# Patient Record
Sex: Male | Born: 1945 | Race: White | Hispanic: No | Marital: Married | Smoking: Never smoker
Health system: Southern US, Community
[De-identification: ages and names within clinical notes are randomized; demographics above are authoritative.]

## PROBLEM LIST (undated history)

## (undated) DIAGNOSIS — K5732 Diverticulitis of large intestine without perforation or abscess without bleeding: Secondary | ICD-10-CM

## (undated) DIAGNOSIS — E119 Type 2 diabetes mellitus without complications: Secondary | ICD-10-CM

## (undated) DIAGNOSIS — K56609 Unspecified intestinal obstruction, unspecified as to partial versus complete obstruction: Secondary | ICD-10-CM

## (undated) DIAGNOSIS — I10 Essential (primary) hypertension: Secondary | ICD-10-CM

## (undated) DIAGNOSIS — N529 Male erectile dysfunction, unspecified: Secondary | ICD-10-CM

## (undated) HISTORY — PX: CHOLECYSTECTOMY OPEN: SUR202

## (undated) HISTORY — PX: ABDOMINAL SURGERY: SHX537

## (undated) HISTORY — PX: BOWEL RESECTION: SHX1257

## (undated) HISTORY — PX: COLOSTOMY TAKEDOWN: SHX5783

## (undated) HISTORY — PX: CHOLECYSTECTOMY: SHX55

---

## 2011-07-09 ENCOUNTER — Ambulatory Visit (INDEPENDENT_AMBULATORY_CARE_PROVIDER_SITE_OTHER): Payer: PRIVATE HEALTH INSURANCE

## 2011-07-09 DIAGNOSIS — M25529 Pain in unspecified elbow: Secondary | ICD-10-CM

## 2011-07-09 DIAGNOSIS — R209 Unspecified disturbances of skin sensation: Secondary | ICD-10-CM

## 2011-07-09 DIAGNOSIS — M25519 Pain in unspecified shoulder: Secondary | ICD-10-CM

## 2011-07-09 DIAGNOSIS — M549 Dorsalgia, unspecified: Secondary | ICD-10-CM

## 2012-01-22 ENCOUNTER — Ambulatory Visit
Admission: RE | Admit: 2012-01-22 | Discharge: 2012-01-22 | Disposition: A | Discharge status: Home / Self Care | Payer: PRIVATE HEALTH INSURANCE | Source: Ambulatory Visit | Attending: Family Medicine | Admitting: Family Medicine

## 2012-01-22 ENCOUNTER — Other Ambulatory Visit: Payer: Self-pay | Admitting: Family Medicine

## 2012-01-22 DIAGNOSIS — R109 Unspecified abdominal pain: Secondary | ICD-10-CM

## 2014-10-23 DIAGNOSIS — M5030 Other cervical disc degeneration, unspecified cervical region: Secondary | ICD-10-CM | POA: Insufficient documentation

## 2014-11-09 ENCOUNTER — Other Ambulatory Visit: Payer: Self-pay | Admitting: Family Medicine

## 2014-11-09 DIAGNOSIS — Z87891 Personal history of nicotine dependence: Secondary | ICD-10-CM

## 2014-11-13 ENCOUNTER — Ambulatory Visit
Admission: RE | Admit: 2014-11-13 | Discharge: 2014-11-13 | Disposition: A | Discharge status: Home / Self Care | Payer: PRIVATE HEALTH INSURANCE | Source: Ambulatory Visit | Attending: Family Medicine | Admitting: Family Medicine

## 2014-11-13 DIAGNOSIS — Z87891 Personal history of nicotine dependence: Secondary | ICD-10-CM

## 2014-11-26 ENCOUNTER — Emergency Department (HOSPITAL_BASED_OUTPATIENT_CLINIC_OR_DEPARTMENT_OTHER): Payer: PPO

## 2014-11-26 ENCOUNTER — Encounter (HOSPITAL_BASED_OUTPATIENT_CLINIC_OR_DEPARTMENT_OTHER): Payer: Self-pay | Admitting: *Deleted

## 2014-11-26 ENCOUNTER — Inpatient Hospital Stay (HOSPITAL_BASED_OUTPATIENT_CLINIC_OR_DEPARTMENT_OTHER)
Admission: EM | Admit: 2014-11-26 | Discharge: 2014-11-30 | DRG: 390 | Disposition: A | Discharge status: Home / Self Care | Payer: PPO | Attending: Internal Medicine | Admitting: Internal Medicine

## 2014-11-26 DIAGNOSIS — K565 Intestinal adhesions [bands] with obstruction (postprocedural) (postinfection): Secondary | ICD-10-CM | POA: Diagnosis present

## 2014-11-26 DIAGNOSIS — R739 Hyperglycemia, unspecified: Secondary | ICD-10-CM | POA: Diagnosis present

## 2014-11-26 DIAGNOSIS — E1165 Type 2 diabetes mellitus with hyperglycemia: Secondary | ICD-10-CM | POA: Diagnosis present

## 2014-11-26 DIAGNOSIS — K5730 Diverticulosis of large intestine without perforation or abscess without bleeding: Secondary | ICD-10-CM | POA: Diagnosis present

## 2014-11-26 DIAGNOSIS — Z0189 Encounter for other specified special examinations: Secondary | ICD-10-CM

## 2014-11-26 DIAGNOSIS — Z87891 Personal history of nicotine dependence: Secondary | ICD-10-CM | POA: Diagnosis not present

## 2014-11-26 DIAGNOSIS — E119 Type 2 diabetes mellitus without complications: Secondary | ICD-10-CM | POA: Diagnosis not present

## 2014-11-26 DIAGNOSIS — Z9049 Acquired absence of other specified parts of digestive tract: Secondary | ICD-10-CM | POA: Diagnosis present

## 2014-11-26 DIAGNOSIS — K5669 Other intestinal obstruction: Secondary | ICD-10-CM | POA: Diagnosis not present

## 2014-11-26 DIAGNOSIS — K56609 Unspecified intestinal obstruction, unspecified as to partial versus complete obstruction: Secondary | ICD-10-CM | POA: Diagnosis present

## 2014-11-26 DIAGNOSIS — R109 Unspecified abdominal pain: Secondary | ICD-10-CM | POA: Diagnosis present

## 2014-11-26 HISTORY — DX: Type 2 diabetes mellitus without complications: E11.9

## 2014-11-26 HISTORY — DX: Diverticulitis of large intestine without perforation or abscess without bleeding: K57.32

## 2014-11-26 LAB — CBC WITH DIFFERENTIAL/PLATELET
BASOS ABS: 0.1 10*3/uL (ref 0.0–0.1)
Basophils Relative: 1 % (ref 0–1)
EOS PCT: 1 % (ref 0–5)
Eosinophils Absolute: 0.1 10*3/uL (ref 0.0–0.7)
HCT: 52.1 % — ABNORMAL HIGH (ref 39.0–52.0)
HEMOGLOBIN: 18 g/dL — AB (ref 13.0–17.0)
Lymphocytes Relative: 8 % — ABNORMAL LOW (ref 12–46)
Lymphs Abs: 1 10*3/uL (ref 0.7–4.0)
MCH: 31.3 pg (ref 26.0–34.0)
MCHC: 34.5 g/dL (ref 30.0–36.0)
MCV: 90.6 fL (ref 78.0–100.0)
MONO ABS: 1 10*3/uL (ref 0.1–1.0)
Monocytes Relative: 8 % (ref 3–12)
Neutro Abs: 10.8 10*3/uL — ABNORMAL HIGH (ref 1.7–7.7)
Neutrophils Relative %: 82 % — ABNORMAL HIGH (ref 43–77)
PLATELETS: 153 10*3/uL (ref 150–400)
RBC: 5.75 MIL/uL (ref 4.22–5.81)
RDW: 13.3 % (ref 11.5–15.5)
WBC: 13 10*3/uL — AB (ref 4.0–10.5)

## 2014-11-26 LAB — COMPREHENSIVE METABOLIC PANEL
ALK PHOS: 84 U/L (ref 38–126)
ALT: 30 U/L (ref 17–63)
ANION GAP: 12 (ref 5–15)
AST: 22 U/L (ref 15–41)
Albumin: 4.9 g/dL (ref 3.5–5.0)
BILIRUBIN TOTAL: 1.3 mg/dL — AB (ref 0.3–1.2)
BUN: 21 mg/dL — ABNORMAL HIGH (ref 6–20)
CHLORIDE: 103 mmol/L (ref 101–111)
CO2: 23 mmol/L (ref 22–32)
Calcium: 9.8 mg/dL (ref 8.9–10.3)
Creatinine, Ser: 1.24 mg/dL (ref 0.61–1.24)
GFR, EST NON AFRICAN AMERICAN: 58 mL/min — AB (ref >60)
Glucose, Bld: 169 mg/dL — ABNORMAL HIGH (ref 65–99)
Potassium: 3.8 mmol/L (ref 3.5–5.1)
Sodium: 138 mmol/L (ref 135–145)
Total Protein: 7.4 g/dL (ref 6.5–8.1)

## 2014-11-26 LAB — URINALYSIS, ROUTINE W REFLEX MICROSCOPIC
Glucose, UA: NEGATIVE mg/dL
Hgb urine dipstick: NEGATIVE
KETONES UR: 15 mg/dL — AB
LEUKOCYTES UA: NEGATIVE
Nitrite: NEGATIVE
PH: 5.5 (ref 5.0–8.0)
Protein, ur: 30 mg/dL — AB
SPECIFIC GRAVITY, URINE: 1.026 (ref 1.005–1.030)
Urobilinogen, UA: 0.2 mg/dL (ref 0.0–1.0)

## 2014-11-26 LAB — URINE MICROSCOPIC-ADD ON

## 2014-11-26 LAB — LIPASE, BLOOD: LIPASE: 23 U/L (ref 22–51)

## 2014-11-26 MED ORDER — LIDOCAINE VISCOUS 2 % MT SOLN
15.0000 mL | Freq: Once | OROMUCOSAL | Status: AC
  Administered 2014-11-26: 15 mL via OROMUCOSAL
  Filled 2014-11-26: qty 15

## 2014-11-26 MED ORDER — MORPHINE SULFATE 4 MG/ML IJ SOLN: 4.0000 mg | INTRAMUSCULAR | Status: DC | PRN

## 2014-11-26 MED ORDER — IOHEXOL 300 MG/ML  SOLN
50.0000 mL | Freq: Once | INTRAMUSCULAR | Status: AC | PRN
  Administered 2014-11-26: 50 mL via ORAL

## 2014-11-26 MED ORDER — SODIUM CHLORIDE 0.9 % IV SOLN
INTRAVENOUS | Status: DC
  Administered 2014-11-26: 1000 mL via INTRAVENOUS
  Administered 2014-11-28 – 2014-11-30 (×3): via INTRAVENOUS

## 2014-11-26 MED ORDER — SODIUM CHLORIDE 0.9 % IV SOLN
INTRAVENOUS | Status: DC
  Administered 2014-11-26: 18:00:00 via INTRAVENOUS

## 2014-11-26 MED ORDER — SODIUM CHLORIDE 0.9 % IV SOLN: INTRAVENOUS | Status: DC

## 2014-11-26 MED ORDER — ONDANSETRON HCL 4 MG/2ML IJ SOLN: 4.0000 mg | Freq: Four times a day (QID) | INTRAMUSCULAR | Status: DC | PRN

## 2014-11-26 MED ORDER — ONDANSETRON HCL 4 MG/2ML IJ SOLN
4.0000 mg | Freq: Once | INTRAMUSCULAR | Status: AC
  Administered 2014-11-26: 4 mg via INTRAVENOUS
  Filled 2014-11-26: qty 2

## 2014-11-26 MED ORDER — DIPHENHYDRAMINE HCL 50 MG/ML IJ SOLN
12.5000 mg | Freq: Once | INTRAMUSCULAR | Status: AC
  Administered 2014-11-27: 12.5 mg via INTRAVENOUS
  Filled 2014-11-26: qty 1

## 2014-11-26 MED ORDER — ACETAMINOPHEN 650 MG RE SUPP
650.0000 mg | Freq: Four times a day (QID) | RECTAL | Status: DC | PRN
Start: 2014-11-26 — End: 2014-11-30

## 2014-11-26 MED ORDER — MORPHINE SULFATE 2 MG/ML IJ SOLN
2.0000 mg | INTRAMUSCULAR | Status: DC | PRN
  Administered 2014-11-27 – 2014-11-28 (×4): 2 mg via INTRAVENOUS
  Filled 2014-11-26 (×4): qty 1

## 2014-11-26 MED ORDER — IOHEXOL 300 MG/ML  SOLN
100.0000 mL | Freq: Once | INTRAMUSCULAR | Status: AC | PRN
  Administered 2014-11-26: 100 mL via INTRAVENOUS

## 2014-11-26 MED ORDER — ONDANSETRON HCL 4 MG PO TABS: 4.0000 mg | ORAL_TABLET | Freq: Four times a day (QID) | ORAL | Status: DC | PRN

## 2014-11-26 MED ORDER — MORPHINE SULFATE 4 MG/ML IJ SOLN
4.0000 mg | Freq: Once | INTRAMUSCULAR | Status: AC
  Administered 2014-11-26: 4 mg via INTRAVENOUS
  Filled 2014-11-26: qty 1

## 2014-11-26 MED ORDER — ACETAMINOPHEN 325 MG PO TABS
650.0000 mg | ORAL_TABLET | Freq: Four times a day (QID) | ORAL | Status: DC | PRN
Start: 2014-11-26 — End: 2014-11-30

## 2014-11-26 NOTE — ED Notes (Signed)
Patient transported to CT 

## 2014-11-26 NOTE — ED Notes (Signed)
Sent here from [facility] for r/o SBO, pt c/o abd pain vomiting x 2 days

## 2014-11-26 NOTE — ED Notes (Signed)
Pt vomited x1.  

## 2014-11-26 NOTE — ED Notes (Signed)
Pt returned to room from CT

## 2014-11-26 NOTE — ED Provider Notes (Signed)
TIME SEEN: 5:35 PM  CHIEF COMPLAINT: Possible small bowel obstruction  HPI: Pt is a 69 y.o. male who has had a prior colon resection due to diverticulitis, cholecystectomy and presents to the emergency department from urgent care with possible bowel obstruction seen on x-ray. Denies history of small bowel obstruction. Reports that yesterday he started having diffuse abdominal cramping and distention of his abdomen. Yesterday had a small bowel movement. Has not had a bowel movement since it is not passing gas. Has had nausea and vomiting. No fever.  ROS: See HPI Constitutional: no fever  Eyes: no drainage  ENT: no runny nose   Cardiovascular:  no chest pain  Resp: no SOB  GI: no vomiting GU: no dysuria Integumentary: no rash  Allergy: no hives  Musculoskeletal: no leg swelling  Neurological: no slurred speech ROS otherwise negative  PAST MEDICAL HISTORY/PAST SURGICAL HISTORY:  Past Medical History  Diagnosis Date  . Diverticulitis of colon     MEDICATIONS:  Prior to Admission medications   Not on File    ALLERGIES:  No Known Allergies  SOCIAL HISTORY:  History  Substance Use Topics  . Smoking status: Never Smoker   . Smokeless tobacco: Not on file  . Alcohol Use: No    FAMILY HISTORY: History reviewed. No pertinent family history.  EXAM: BP 147/97 mmHg  Pulse 128  Temp(Src) 98.5 F (36.9 C)  Resp 18  Ht 5\' 11"  (1.803 m)  Wt 234 lb (106.142 kg)  BMI 32.65 kg/m2  SpO2 97% CONSTITUTIONAL: Alert and oriented and responds appropriately to questions. Well-appearing; well-nourished, appears uncomfortable but nontoxic HEAD: Normocephalic EYES: Conjunctivae clear, PERRL ENT: normal nose; no rhinorrhea; moist mucous membranes; pharynx without lesions noted NECK: Supple, no meningismus, no LAD  CARD: Regular and tachycardic; S1 and S2 appreciated; no murmurs, no clicks, no rubs, no gallops RESP: Normal chest excursion without splinting or tachypnea; breath sounds  clear and equal bilaterally; no wheezes, no rhonchi, no rales, no hypoxia or respiratory distress, speaking full sentences ABD/GI: Normal bowel sounds; distended with mild tympany, soft, diffuse tenderness to palpation, no rebound, no guarding, no peritoneal signs BACK:  The back appears normal and is non-tender to palpation, there is no CVA tenderness EXT: Normal ROM in all joints; non-tender to palpation; no edema; normal capillary refill; no cyanosis, no calf tenderness or swelling    SKIN: Normal color for age and race; warm NEURO: Moves all extremities equally, sensation to light touch intact diffusely, cranial nerves II through XII intact PSYCH: The patient's mood and manner are appropriate. Grooming and personal hygiene are appropriate.  MEDICAL DECISION MAKING: Patient here with possible obstruction seen on x-ray at urgent care. Has never had an obstruction before. We'll proceed with CT scan to confirm, labs, urine. Will give pain and nausea medicine as well as IV fluids.  ED PROGRESS: 8:20 PM Labs show leukocytosis of 13,000 with left shift. CT scan shows high grade bowel obstruction with transition point at the periumbilical region likely secondary to adhesions. Discussed with Dr. Harlow Asa with general surgery who see the patient in consult. Patient's primary care provider is Dr. Marijo File with [facility]. We'll discuss with hospitalist for admission. Patient updated with plan. We'll keep nothing by mouth and placed gastric tube. We'll continue IV hydration.   8:30 PM  Spoke with Dr. Chancy Milroy with hospitalist service who agrees on admission to med/surg bed at [facility]. Will transfer.  [place], DO 11/26/14 2032

## 2014-11-26 NOTE — H&P (Signed)
[facility] History and Physical  Tanner [name] WHQ:[id number] DOB: 1946/01/20 DOA: 11/26/2014  Referring physician: Pryor Curia, DO PCP: Tanner Kroner, MD   Chief Complaint: Abdominal pain  HPI: Tanner [name] is [name] 69 y.o. male with prior history of diverticulitis and s/p colon resection came to [facility] with possible bowel obstruction. [name] CT scan which was done shows presence of high grade mid small bowel obstruction which may be chronic by appearance on the  CT. He staets that he got up yesterday and was having pain all over his abdomen. Patient states he went to the urgent care and had [name] abdominal xray done which was suspicuous for obstruction. He was referred for [name] CT scan of the abdomen and this shows presence of possible bowel obstruction. He staets that he has had vomiting yesterday and today. He states the does feel [name] little better with the NG tube. He states in 1987 he had [name] perforation which is what lead to surgery and [name] temporary colostomy which was later taken down. He has no fevers or chills noted. He states that he has been having sweating when he had the nausea. He has no chest pain. He has no history of Diabetes or HTN. He states he recently had [name] physical exam and everything checked out OK as far as his labs etc.   Review of Systems:  Constitutional:  No weight loss, night sweats, Fevers, chills, fatigue.  HEENT:  No headaches, itching, ear ache, nasal congestion, post nasal drip,  Cardio-vascular:  No chest pain, swelling in lower extremities, dizziness, palpitations  GI:  No heartburn, indigestion, +abdominal pain, +nausea, +vomiting, no diarrhea  Resp:  No shortness of breath with exertion or at rest. No coughing up of blood.No change in color of mucus.No wheezing Skin:  no rash or lesions GU:  no dysuria, change in color of urine, no urgency or frequency  Musculoskeletal:  No joint pain or swelling. No decreased range of motion  Psych:  No change in  mood or affect. No depression or anxiety   Past Medical History  Diagnosis Date  . Diverticulitis of colon    Past Surgical History  Procedure Laterality Date  . Cholecystectomy    . Abdominal surgery    . Bowel resection     Social History:  reports that he has never smoked. He does not have any smokeless tobacco history on file. He reports that he does not drink alcohol or use illicit drugs.  No Known Allergies  History reviewed. No pertinent family history.   Prior to Admission medications   Not on File   Physical Exam: Filed Vitals:   11/26/14 2120 11/26/14 2200 11/26/14 2203 11/26/14 2228  BP: 159/87 155/98 160/86   Pulse: 107 106 103   Temp:  97.5 F (36.4 C) 97.7 F (36.5 C)   TempSrc:  Oral Oral   Resp: 16 16 16    Height:    5\' 11"  (1.803 m)  Weight:    106.595 kg (235 lb)  SpO2: 96% 95% 96%     Wt Readings from Last 3 Encounters:  11/26/14 106.595 kg (235 lb)    General:  Appears calm and comfortable Eyes: PERRL, normal lids, irises & conjunctiva ENT: grossly normal hearing, lips & tongue Neck: no LAD, masses or thyromegaly Cardiovascular: RRR, no m/r/g. No LE edema. Respiratory: CTA bilaterally, no w/r/r. Normal respiratory effort. Abdomen: soft, non tender BS present though diminshed Skin: no rash or induration seen on limited exam Musculoskeletal:  grossly normal tone BUE/BLE Psychiatric: grossly normal mood and affect, speech fluent and appropriate Neurologic: grossly non-focal.          Labs on Admission:  Basic Metabolic Panel:  Recent Labs Lab 11/26/14 1750  NA 138  K 3.8  CL 103  CO2 23  GLUCOSE 169*  BUN 21*  CREATININE 1.24  CALCIUM 9.8   Liver Function Tests:  Recent Labs Lab 11/26/14 1750  AST 22  ALT 30  ALKPHOS 84  BILITOT 1.3*  PROT 7.4  ALBUMIN 4.9    Recent Labs Lab 11/26/14 1750  LIPASE 23   No results for input(s): AMMONIA in the last 168 hours. CBC:  Recent Labs Lab 11/26/14 1750  WBC 13.0*    NEUTROABS 10.8*  HGB 18.0*  HCT 52.1*  MCV 90.6  PLT 153   Cardiac Enzymes: No results for input(s): CKTOTAL, CKMB, CKMBINDEX, TROPONINI in the last 168 hours.  BNP (last 3 results) No results for input(s): BNP in the last 8760 hours.  ProBNP (last 3 results) No results for input(s): PROBNP in the last 8760 hours.  CBG: No results for input(s): GLUCAP in the last 168 hours.  Radiological Exams on Admission: Ct Abdomen Pelvis W Contrast  11/26/2014   CLINICAL DATA:  Generalized abdominal pain with nausea and vomiting for 1 day. Leukocytosis. History of partial bowel resection, cholecystectomy and diverticulitis. Initial encounter.  EXAM: CT ABDOMEN AND PELVIS WITH CONTRAST  TECHNIQUE: Multidetector CT imaging of the abdomen and pelvis was performed using the standard protocol following bolus administration of intravenous contrast.  CONTRAST:  12mL OMNIPAQUE IOHEXOL 300 MG/ML SOLN, 163mL OMNIPAQUE IOHEXOL 300 MG/ML SOLN  COMPARISON:  Abdominal pelvic CT 01/22/2012.  FINDINGS: Lower chest: Clear lung bases. No significant pleural or pericardial effusion.  Hepatobiliary: The liver is normal in density without focal abnormality. No significant biliary dilatation status post cholecystectomy)  Pancreas: Unremarkable. No pancreatic ductal dilatation or surrounding inflammatory changes.  Spleen: Normal in size without focal abnormality.  Adrenals/Urinary Tract: Both adrenal glands appear normal.2 mm calculus in the lower pole of the right kidney is best seen on coronal image number 70. No other urinary tract calculi demonstrated. There are probable tiny cyst in the lower pole of the right kidney. The left kidney appears normal. There is no hydronephrosis. No bladder abnormality seen.  Stomach/Bowel: There is [name] small hiatal hernia. The stomach is distended with fluid and contrast. The duodenum, jejunum and proximal ileum are moderately distended with contrast. There is fecalization of the mid ileum  proximal to an apparent focal transition point in the periumbilical area, best seen on axial image 60. The distal small bowel and colon are relatively decompressed. The appendix is not clearly seen. There is no evidence of pericecal inflammation.There is mesenteric edema with mild interloop fluid in the mid abdomen, best seen on coronal images 44-54. There is no focal extraluminal fluid collection, extravasated enteric contrast or evidence of closed loop obstruction.  Vascular/Lymphatic: There are no enlarged abdominal or pelvic lymph nodes. There is moderate atherosclerosis of the aorta, its branches and the iliac arteries. No large vessel occlusion identified. No significant venous abnormalities.  Reproductive: Moderate enlargement of the prostate gland, measuring up to 6.7 cm transverse.  Other: There are probable postsurgical changes within the anterior abdominal wall. No recurrent hernia identified. The right rectus abdominus muscle is atrophied.  Musculoskeletal: No acute or significant osseous findings.  IMPRESSION: 1. High-grade mid small bowel obstruction, likely from adhesions. Focal transition point appears to  be within the periumbilical region. Underlying fecalization of the small bowel proximal to the transition point implies some chronicity. 2. There is mesenteric edema and [name] small amount of fluid, but no focal abscess. 3. Nonobstructing right renal calculus. No evidence of ureteral calculus or hydronephrosis.   Electronically Signed   By: Richardean Sale M.D.   On: 11/26/2014 19:54      Assessment/Plan Active Problems:   Bowel obstruction   SBO (small bowel obstruction)   Diverticulosis of colon without hemorrhage   Hyperglycemia   1. SBO -discussed case with surgery -will keep NPO -NGT to low wall intermittent suctioning -pain control no antibiotics per surgery at this time -if there is deterioration will need to go to OR tonight otherwise surgery will evaluate in  am  2. Diverticulosis of colon -no inflammation noted no indication for antibiotics  3. Hyperglycemia -will monitor FSBS -institute SSI if sugars are persistently elevated -will check A1C   Code Status: Full Code (must indicate code status--if unknown or must be presumed, indicate so) DVT Prophylaxis:SCD Family Communication: None (indicate person spoken with, if applicable, with phone number if by telephone) Disposition Plan: Home (indicate anticipated LOS)  Time spent: 19min  Tanner [name] [facility] Pager [phone]

## 2014-11-27 ENCOUNTER — Encounter (HOSPITAL_COMMUNITY): Payer: Self-pay | Admitting: General Surgery

## 2014-11-27 ENCOUNTER — Inpatient Hospital Stay (HOSPITAL_COMMUNITY): Payer: PPO

## 2014-11-27 DIAGNOSIS — K5730 Diverticulosis of large intestine without perforation or abscess without bleeding: Secondary | ICD-10-CM

## 2014-11-27 LAB — CBC
HCT: 50.3 % (ref 39.0–52.0)
Hemoglobin: 16.8 g/dL (ref 13.0–17.0)
MCH: 31.2 pg (ref 26.0–34.0)
MCHC: 33.4 g/dL (ref 30.0–36.0)
MCV: 93.5 fL (ref 78.0–100.0)
PLATELETS: 144 10*3/uL — AB (ref 150–400)
RBC: 5.38 MIL/uL (ref 4.22–5.81)
RDW: 13.4 % (ref 11.5–15.5)
WBC: 11 10*3/uL — ABNORMAL HIGH (ref 4.0–10.5)

## 2014-11-27 LAB — COMPREHENSIVE METABOLIC PANEL
ALT: 30 U/L (ref 17–63)
AST: 21 U/L (ref 15–41)
Albumin: 4.7 g/dL (ref 3.5–5.0)
Alkaline Phosphatase: 76 U/L (ref 38–126)
Anion gap: 13 (ref 5–15)
BUN: 21 mg/dL — ABNORMAL HIGH (ref 6–20)
CO2: 28 mmol/L (ref 22–32)
CREATININE: 1.16 mg/dL (ref 0.61–1.24)
Calcium: 9.2 mg/dL (ref 8.9–10.3)
Chloride: 99 mmol/L — ABNORMAL LOW (ref 101–111)
GFR calc non Af Amer: >60 mL/min (ref >60)
GLUCOSE: 142 mg/dL — AB (ref 65–99)
Potassium: 3.7 mmol/L (ref 3.5–5.1)
SODIUM: 140 mmol/L (ref 135–145)
Total Bilirubin: 1.6 mg/dL — ABNORMAL HIGH (ref 0.3–1.2)
Total Protein: 7.4 g/dL (ref 6.5–8.1)

## 2014-11-27 LAB — GLUCOSE, CAPILLARY
GLUCOSE-CAPILLARY: 147 mg/dL — AB (ref 65–99)
Glucose-Capillary: 145 mg/dL — ABNORMAL HIGH (ref 65–99)
Glucose-Capillary: 117 mg/dL — ABNORMAL HIGH (ref 65–99)
Glucose-Capillary: 120 mg/dL — ABNORMAL HIGH (ref 65–99)

## 2014-11-27 LAB — TSH: TSH: 3.187 u[IU]/mL (ref 0.350–4.500)

## 2014-11-27 MED ORDER — DIPHENHYDRAMINE HCL 50 MG/ML IJ SOLN
INTRAMUSCULAR | Status: AC
  Filled 2014-11-27: qty 1

## 2014-11-27 MED ORDER — DIATRIZOATE MEGLUMINE & SODIUM 66-10 % PO SOLN
90.0000 mL | Freq: Once | ORAL | Status: AC
  Administered 2014-11-27: 90 mL via NASOGASTRIC
  Filled 2014-11-27: qty 90

## 2014-11-27 MED ORDER — INSULIN ASPART 100 UNIT/ML ~~LOC~~ SOLN
0.0000 [IU] | SUBCUTANEOUS | Status: DC
  Administered 2014-11-27 – 2014-11-29 (×7): 1 [IU] via SUBCUTANEOUS

## 2014-11-27 NOTE — Consult Note (Signed)
Reason for Consult: small bowel obstruction Referring Physician: DR. Estill Cotta   HPI: Tanner [name] is a 69 year old male with a history of partial colectomy with subsequent colostomy takedown in the 1980s and an open cholecystectomy presenting with 4 days of abdominal pain. No aggravating or alleviating factors.  No modifying factors.  Characterized as sharp and cramping pain.  Time pattern is constant although severity waxes and wanes.  Location is to mid abdomen with radiation to the sides.  Associated with nausea, vomiting.  His last bowel movement was 4 days ago and is not passing flatus.  He is having hiccups.  He denies fever, chills or sweats.  His work up shows a high grade small bowel obstruction with a focal transition point within the periumbilical region.  We have therefore been asked to evaluate.   WBC is down to 11k from 13k, normal renal function.  He has about 539m in the cannister of dark bilious output.  No flatus or BM yet.    Past Medical History  Diagnosis Date  . Diverticulitis of colon     Past Surgical History  Procedure Laterality Date  . Cholecystectomy    . Abdominal surgery    . Bowel resection    . Cholecystectomy open    . Colostomy takedown  1980s    History reviewed. No pertinent family history.  Social History:  reports that he has never smoked. He does not have any smokeless tobacco history on file. He reports that he does not drink alcohol or use illicit drugs.  Allergies: No Known Allergies  Medications:  Prior to Admission medications   Not on File   Scheduled Meds: . diatrizoate meglumine-sodium  90 mL Per NG tube Once  . diphenhydrAMINE      . insulin aspart  0-9 Units Subcutaneous 6 times per day   Continuous Infusions: . sodium chloride 1,000 mL (11/26/14 2343)   PRN Meds:.acetaminophen **OR** acetaminophen, morphine injection, ondansetron **OR** ondansetron (ZOFRAN) IV   Results for orders placed or performed during the  hospital encounter of 11/26/14 (from the past 48 hour(s))  CBC with Differential     Status: Abnormal   Collection Time: 11/26/14  5:50 PM  Result Value Ref Range   WBC 13.0 (H) 4.0 - 10.5 K/uL   RBC 5.75 4.22 - 5.81 MIL/uL   Hemoglobin 18.0 (H) 13.0 - 17.0 g/dL   HCT 52.1 (H) 39.0 - 52.0 %   MCV 90.6 78.0 - 100.0 fL   MCH 31.3 26.0 - 34.0 pg   MCHC 34.5 30.0 - 36.0 g/dL   RDW 13.3 11.5 - 15.5 %   Platelets 153 150 - 400 K/uL   Neutrophils Relative % 82 (H) 43 - 77 %   Neutro Abs 10.8 (H) 1.7 - 7.7 K/uL   Lymphocytes Relative 8 (L) 12 - 46 %   Lymphs Abs 1.0 0.7 - 4.0 K/uL   Monocytes Relative 8 3 - 12 %   Monocytes Absolute 1.0 0.1 - 1.0 K/uL   Eosinophils Relative 1 0 - 5 %   Eosinophils Absolute 0.1 0.0 - 0.7 K/uL   Basophils Relative 1 0 - 1 %   Basophils Absolute 0.1 0.0 - 0.1 K/uL  Comprehensive metabolic panel     Status: Abnormal   Collection Time: 11/26/14  5:50 PM  Result Value Ref Range   Sodium 138 135 - 145 mmol/L   Potassium 3.8 3.5 - 5.1 mmol/L   Chloride 103 101 - 111 mmol/L  CO2 23 22 - 32 mmol/L   Glucose, Bld 169 (H) 65 - 99 mg/dL   BUN 21 (H) 6 - 20 mg/dL   Creatinine, Ser 1.24 0.61 - 1.24 mg/dL   Calcium 9.8 8.9 - 10.3 mg/dL   Total Protein 7.4 6.5 - 8.1 g/dL   Albumin 4.9 3.5 - 5.0 g/dL   AST 22 15 - 41 U/L   ALT 30 17 - 63 U/L   Alkaline Phosphatase 84 38 - 126 U/L   Total Bilirubin 1.3 (H) 0.3 - 1.2 mg/dL   GFR calc non Af Amer 58 (L) >60 mL/min   GFR calc Af Amer >60 >60 mL/min    Comment: (NOTE) The eGFR has been calculated using the CKD EPI equation. This calculation has not been validated in all clinical situations. eGFR's persistently <60 mL/min signify possible Chronic Kidney Disease.    Anion gap 12 5 - 15  Lipase, blood     Status: None   Collection Time: 11/26/14  5:50 PM  Result Value Ref Range   Lipase 23 22 - 51 U/L  Urinalysis, Routine w reflex microscopic     Status: Abnormal   Collection Time: 11/26/14  6:30 PM  Result  Value Ref Range   Color, Urine AMBER (A) YELLOW    Comment: BIOCHEMICALS MAY BE AFFECTED BY COLOR   APPearance CLEAR CLEAR   Specific Gravity, Urine 1.026 1.005 - 1.030   pH 5.5 5.0 - 8.0   Glucose, UA NEGATIVE NEGATIVE mg/dL   Hgb urine dipstick NEGATIVE NEGATIVE   Bilirubin Urine SMALL (A) NEGATIVE   Ketones, ur 15 (A) NEGATIVE mg/dL   Protein, ur 30 (A) NEGATIVE mg/dL   Urobilinogen, UA 0.2 0.0 - 1.0 mg/dL   Nitrite NEGATIVE NEGATIVE   Leukocytes, UA NEGATIVE NEGATIVE  Urine microscopic-add on     Status: Abnormal   Collection Time: 11/26/14  6:30 PM  Result Value Ref Range   Squamous Epithelial / LPF RARE RARE   WBC, UA 3-6 <3 WBC/hpf   RBC / HPF 0-2 <3 RBC/hpf   Bacteria, UA MANY (A) RARE   Casts GRANULAR CAST (A) NEGATIVE   Crystals CA OXALATE CRYSTALS (A) NEGATIVE   Urine-Other MUCOUS PRESENT   TSH     Status: None   Collection Time: 11/27/14  4:35 AM  Result Value Ref Range   TSH 3.187 0.350 - 4.500 uIU/mL  Comprehensive metabolic panel     Status: Abnormal   Collection Time: 11/27/14  4:35 AM  Result Value Ref Range   Sodium 140 135 - 145 mmol/L   Potassium 3.7 3.5 - 5.1 mmol/L   Chloride 99 (L) 101 - 111 mmol/L   CO2 28 22 - 32 mmol/L   Glucose, Bld 142 (H) 65 - 99 mg/dL   BUN 21 (H) 6 - 20 mg/dL   Creatinine, Ser 1.16 0.61 - 1.24 mg/dL   Calcium 9.2 8.9 - 10.3 mg/dL   Total Protein 7.4 6.5 - 8.1 g/dL   Albumin 4.7 3.5 - 5.0 g/dL   AST 21 15 - 41 U/L   ALT 30 17 - 63 U/L   Alkaline Phosphatase 76 38 - 126 U/L   Total Bilirubin 1.6 (H) 0.3 - 1.2 mg/dL   GFR calc non Af Amer >60 >60 mL/min   GFR calc Af Amer >60 >60 mL/min    Comment: (NOTE) The eGFR has been calculated using the CKD EPI equation. This calculation has not been validated in all clinical situations. eGFR's  persistently <60 mL/min signify possible Chronic Kidney Disease.    Anion gap 13 5 - 15  CBC     Status: Abnormal   Collection Time: 11/27/14  4:35 AM  Result Value Ref Range   WBC  11.0 (H) 4.0 - 10.5 K/uL   RBC 5.38 4.22 - 5.81 MIL/uL   Hemoglobin 16.8 13.0 - 17.0 g/dL   HCT 50.3 39.0 - 52.0 %   MCV 93.5 78.0 - 100.0 fL   MCH 31.2 26.0 - 34.0 pg   MCHC 33.4 30.0 - 36.0 g/dL   RDW 13.4 11.5 - 15.5 %   Platelets 144 (L) 150 - 400 K/uL  Glucose, capillary     Status: Abnormal   Collection Time: 11/27/14  9:18 AM  Result Value Ref Range   Glucose-Capillary 145 (H) 65 - 99 mg/dL    Ct Abdomen Pelvis W Contrast  11/26/2014   CLINICAL DATA:  Generalized abdominal pain with nausea and vomiting for 1 day. Leukocytosis. History of partial bowel resection, cholecystectomy and diverticulitis. Initial encounter.  EXAM: CT ABDOMEN AND PELVIS WITH CONTRAST  TECHNIQUE: Multidetector CT imaging of the abdomen and pelvis was performed using the standard protocol following bolus administration of intravenous contrast.  CONTRAST:  87m OMNIPAQUE IOHEXOL 300 MG/ML SOLN, 1035mOMNIPAQUE IOHEXOL 300 MG/ML SOLN  COMPARISON:  Abdominal pelvic CT 01/22/2012.  FINDINGS: Lower chest: Clear lung bases. No significant pleural or pericardial effusion.  Hepatobiliary: The liver is normal in density without focal abnormality. No significant biliary dilatation status post cholecystectomy)  Pancreas: Unremarkable. No pancreatic ductal dilatation or surrounding inflammatory changes.  Spleen: Normal in size without focal abnormality.  Adrenals/Urinary Tract: Both adrenal glands appear normal.2 mm calculus in the lower pole of the right kidney is best seen on coronal image number 70. No other urinary tract calculi demonstrated. There are probable tiny cyst in the lower pole of the right kidney. The left kidney appears normal. There is no hydronephrosis. No bladder abnormality seen.  Stomach/Bowel: There is a small hiatal hernia. The stomach is distended with fluid and contrast. The duodenum, jejunum and proximal ileum are moderately distended with contrast. There is fecalization of the mid ileum proximal to an  apparent focal transition point in the periumbilical area, best seen on axial image 60. The distal small bowel and colon are relatively decompressed. The appendix is not clearly seen. There is no evidence of pericecal inflammation.There is mesenteric edema with mild interloop fluid in the mid abdomen, best seen on coronal images 44-54. There is no focal extraluminal fluid collection, extravasated enteric contrast or evidence of closed loop obstruction.  Vascular/Lymphatic: There are no enlarged abdominal or pelvic lymph nodes. There is moderate atherosclerosis of the aorta, its branches and the iliac arteries. No large vessel occlusion identified. No significant venous abnormalities.  Reproductive: Moderate enlargement of the prostate gland, measuring up to 6.7 cm transverse.  Other: There are probable postsurgical changes within the anterior abdominal wall. No recurrent hernia identified. The right rectus abdominus muscle is atrophied.  Musculoskeletal: No acute or significant osseous findings.  IMPRESSION: 1. High-grade mid small bowel obstruction, likely from adhesions. Focal transition point appears to be within the periumbilical region. Underlying fecalization of the small bowel proximal to the transition point implies some chronicity. 2. There is mesenteric edema and a small amount of fluid, but no focal abscess. 3. Nonobstructing right renal calculus. No evidence of ureteral calculus or hydronephrosis.   Electronically Signed   By: WiCaryl Comes.  On: 11/26/2014 19:54   Dg Abd 2 Views  11/27/2014   CLINICAL DATA:  Small bowel obstruction.  EXAM: ABDOMEN - 2 VIEW  COMPARISON:  11/26/2014  FINDINGS: Persistent distended small bowel loops in left abdomen with air-fluid levels consistent with small bowel obstruction. NG tube in place with tip in mid stomach. Postcholecystectomy surgical clips. Contrast material from yesterday CT scan noted within urinary bladder.  IMPRESSION: Persistent distended small  bowel loops in left abdomen with air-fluid levels consistent with small bowel obstruction. NG tube in place.   Electronically Signed   By: Lahoma Crocker M.D.   On: 11/27/2014 08:52    Review of Systems  All other systems reviewed and are negative.  Blood pressure 160/91, pulse 94, temperature 97.9 F (36.6 C), temperature source Oral, resp. rate 16, height 5' 11"  (1.803 m), weight 106.595 kg (235 lb), SpO2 94 %. Physical Exam  Constitutional: He is oriented to person, place, and time. He appears well-developed and well-nourished. No distress.  Cardiovascular: Normal rate, regular rhythm, normal heart sounds and intact distal pulses.  Exam reveals no gallop and no friction rub.   No murmur heard. Respiratory: Effort normal and breath sounds normal. No respiratory distress. He has no wheezes. He has no rales. He exhibits no tenderness.  GI: Bowel sounds are normal. He exhibits distension. He exhibits no mass. There is no tenderness. There is no rebound and no guarding.  Musculoskeletal: Normal range of motion. He exhibits no edema or tenderness.  Neurological: He is alert and oriented to person, place, and time.  Skin: Skin is warm and dry. No rash noted. He is not diaphoretic. No erythema. No pallor.  Psychiatric: He has a normal mood and affect. His behavior is normal. Thought content normal.    Assessment/Plan: Small bowel obstruction: He has a previous exploratory laparotomy, partial colectomy with colostomy takedown and an open cholecystectomy.  Obstruction is likely secondary to adhesions. He does not appear toxic and does not need urgent surgical intervention.  I do not think the patient will open up.  Nonetheless, will initiate the small bowel protocol.  Discussed with nursing.  Continue with NGT decompression, bowel rest, monitor electrolytes, ambulate as tolerated. VTE prophylaxis-may add heparin/lovneox, SCDs FEN-NPO, NGT  Tanner [name] ANP-BC 11/27/2014, 10:12 AM

## 2014-11-27 NOTE — Progress Notes (Signed)
[facility]                                                                              Patient Demographics  Tanner [name], is a 69 y.o. male, DOB - [birth year], GGE:[id number]  Admit date - 11/26/2014   Admitting Physician Allyne Gee, MD  Outpatient Primary MD for the patient is Gara Kroner, MD  LOS - 1   Chief Complaint  Patient presents with  . Abdominal Pain       Brief HPI   Patient is a 69 year old male with prior history of diverticulitis, status post colon resection presented with abdominal pain at [facility]. Patient got up yesterday and was having pain all over his abdomen, went to urgent care, had an abdominal x-ray which was suspicious for obstruction. CT scan of the abdomen showed high-grade mid small bowel obstruction likely from adhesions. Patient was admitted for further workup.   Assessment & Plan    Principal Problem:   SBO (small bowel obstruction) - Continue NPO, IV fluids, NG to low intermittent wall suction - Gen. surgery consulted, will follow recommendations  Active Problems:   Diverticulosis of colon without hemorrhage - Currently no inflammation noted on the CT scan    Hyperglycemia  follow hemoglobin A1, placed on sliding scale insulin c   Code Status: Full code  Family Communication: Discussed in detail with the patient, all imaging results, lab results explained to the patient and wife at bedside   Disposition Plan: home when SBO resolved  Time Spent in minutes  25 minutes  Procedures  CT abd  Consults   surgery  DVT Prophylaxis  SCD's  Medications  Scheduled Meds: . diphenhydrAMINE       Continuous Infusions: . sodium chloride 1,000 mL (11/26/14 2343)   PRN Meds:.acetaminophen **OR** acetaminophen, morphine injection, ondansetron **OR** ondansetron (ZOFRAN) IV   Antibiotics   Anti-infectives    None        Subjective:   Tanner [name] was seen and examined today.  Patient denies  dizziness, chest pain, shortness of breath, new weakness, numbess, tingling. No acute events overnight.  Abdominal pain currently controlled, no nausea or vomiting, bilious output in the NG suction  Objective:   Blood pressure 160/91, pulse 94, temperature 97.9 F (36.6 C), temperature source Oral, resp. rate 16, height 5\' 11"  (1.803 m), weight 106.595 kg (235 lb), SpO2 94 %.  Wt Readings from Last 3 Encounters:  11/26/14 106.595 kg (235 lb)     Intake/Output Summary (Last 24 hours) at 11/27/14 0829 Last data filed at 11/27/14 0600  Gross per 24 hour  Intake 314.17 ml  Output   1800 ml  Net -1485.83 ml    Exam  General: Alert and oriented x 3, NAD  HEENT:  PERRLA, EOMI, Anicteic Sclera, mucous membranes moist. NG tube  Neck: Supple, no JVD, no masses  CVS: S1 S2 auscultated, no rubs, murmurs or gallops. Regular rate and rhythm.  Respiratory: Clear to auscultation bilaterally, no wheezing, rales or rhonchi  Abdomen: Soft, nontender, nondistended, hypoactive bowel   Ext: no cyanosis clubbing or edema  Neuro: AAOx3, Cr N's II- XII. Strength  5/5 upper and lower extremities bilaterally  Skin: No rashes  Psych: Normal affect and demeanor, alert and oriented x3    Data Review   Micro Results No results found for this or any previous visit (from the past 240 hour(s)).  Radiology Reports Ct Abdomen Pelvis W Contrast  11/26/2014   CLINICAL DATA:  Generalized abdominal pain with nausea and vomiting for 1 day. Leukocytosis. History of partial bowel resection, cholecystectomy and diverticulitis. Initial encounter.  EXAM: CT ABDOMEN AND PELVIS WITH CONTRAST  TECHNIQUE: Multidetector CT imaging of the abdomen and pelvis was performed using the standard protocol following bolus administration of intravenous contrast.  CONTRAST:  35mL OMNIPAQUE IOHEXOL 300 MG/ML SOLN, 119mL OMNIPAQUE IOHEXOL 300 MG/ML SOLN  COMPARISON:  Abdominal pelvic CT 01/22/2012.  FINDINGS: Lower chest: Clear  lung bases. No significant pleural or pericardial effusion.  Hepatobiliary: The liver is normal in density without focal abnormality. No significant biliary dilatation status post cholecystectomy)  Pancreas: Unremarkable. No pancreatic ductal dilatation or surrounding inflammatory changes.  Spleen: Normal in size without focal abnormality.  Adrenals/Urinary Tract: Both adrenal glands appear normal.2 mm calculus in the lower pole of the right kidney is best seen on coronal image number 70. No other urinary tract calculi demonstrated. There are probable tiny cyst in the lower pole of the right kidney. The left kidney appears normal. There is no hydronephrosis. No bladder abnormality seen.  Stomach/Bowel: There is a small hiatal hernia. The stomach is distended with fluid and contrast. The duodenum, jejunum and proximal ileum are moderately distended with contrast. There is fecalization of the mid ileum proximal to an apparent focal transition point in the periumbilical area, best seen on axial image 60. The distal small bowel and colon are relatively decompressed. The appendix is not clearly seen. There is no evidence of pericecal inflammation.There is mesenteric edema with mild interloop fluid in the mid abdomen, best seen on coronal images 44-54. There is no focal extraluminal fluid collection, extravasated enteric contrast or evidence of closed loop obstruction.  Vascular/Lymphatic: There are no enlarged abdominal or pelvic lymph nodes. There is moderate atherosclerosis of the aorta, its branches and the iliac arteries. No large vessel occlusion identified. No significant venous abnormalities.  Reproductive: Moderate enlargement of the prostate gland, measuring up to 6.7 cm transverse.  Other: There are probable postsurgical changes within the anterior abdominal wall. No recurrent hernia identified. The right rectus abdominus muscle is atrophied.  Musculoskeletal: No acute or significant osseous findings.   IMPRESSION: 1. High-grade mid small bowel obstruction, likely from adhesions. Focal transition point appears to be within the periumbilical region. Underlying fecalization of the small bowel proximal to the transition point implies some chronicity. 2. There is mesenteric edema and a small amount of fluid, but no focal abscess. 3. Nonobstructing right renal calculus. No evidence of ureteral calculus or hydronephrosis.   Electronically Signed   By: Richardean Sale M.D.   On: 11/26/2014 19:54   [place] Screening Aaa  11/13/2014   CLINICAL DATA:  Former smoker.  Abdominal aortic aneurysm screening.  EXAM: ULTRASOUND OF ABDOMINAL AORTA  TECHNIQUE: Ultrasound examination of the abdominal aorta was performed to evaluate for abdominal aortic aneurysm.  COMPARISON:  CT abdomen pelvis - 01/22/2012  FINDINGS: Examination is degraded due to patient body habitus and overlying bowel gas.  Abdominal Aorta  There is mild fusiform ectasia of the mid aspect of the abdominal aorta.  Proximal:  2.9 x 2.9 cm.  Mid:  2.9 x 2.9 cm.  Distal:  2.1 x 2.4 cm.  Right common iliac artery:  Normal in size measuring 1.6 x 1.8 cm.  Left Common iliac artery:  Normal in size measuring 1.4 x 1.7 cm.  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 As such, a followup by ultrasound in 5 years is recommended. This recommendation follows ACR consensus guidelines: White Paper of the ACR Incidental Findings Committee II on Vascular Findings. J Am Coll Radiol 2013; 10:789-794.   Electronically Signed   By: Sandi Mariscal M.D.   On: 11/13/2014 08:23    CBC  Recent Labs Lab 11/26/14 1750 11/27/14 0435  WBC 13.0* 11.0*  HGB 18.0* 16.8  HCT 52.1* 50.3  PLT 153 144*  MCV 90.6 93.5  MCH 31.3 31.2  MCHC 34.5 33.4  RDW 13.3 13.4  LYMPHSABS 1.0  --   MONOABS 1.0  --   EOSABS 0.1  --   BASOSABS 0.1  --     Chemistries   Recent Labs Lab  11/26/14 1750 11/27/14 0435  NA 138 140  K 3.8 3.7  CL 103 99*  CO2 23 28  GLUCOSE 169* 142*  BUN 21* 21*  CREATININE 1.24 1.16  CALCIUM 9.8 9.2  AST 22 21  ALT 30 30  ALKPHOS 84 76  BILITOT 1.3* 1.6*   ------------------------------------------------------------------------------------------------------------------ estimated creatinine clearance is 74.6 mL/min (by C-G formula based on Cr of 1.16). ------------------------------------------------------------------------------------------------------------------ No results for input(s): HGBA1C in the last 72 hours. ------------------------------------------------------------------------------------------------------------------ No results for input(s): CHOL, HDL, LDLCALC, TRIG, CHOLHDL, LDLDIRECT in the last 72 hours. ------------------------------------------------------------------------------------------------------------------  Recent Labs  11/27/14 0435  TSH 3.187   ------------------------------------------------------------------------------------------------------------------ No results for input(s): VITAMINB12, FOLATE, FERRITIN, TIBC, IRON, RETICCTPCT in the last 72 hours.  Coagulation profile No results for input(s): INR, PROTIME in the last 168 hours.  No results for input(s): DDIMER in the last 72 hours.  Cardiac Enzymes No results for input(s): CKMB, TROPONINI, MYOGLOBIN in the last 168 hours.  Invalid input(s): CK ------------------------------------------------------------------------------------------------------------------ Invalid input(s): POCBNP  No results for input(s): GLUCAP in the last 72 hours.   Kirstin Kugler M.D. [facility] 11/27/2014, 8:29 AM  Pager: [phone]   Between 7am to 7pm - call Pager - [phone]  After 7pm go to [email or web address] - password [email or web address]  Call night coverage person covering after 7pm

## 2014-11-28 DIAGNOSIS — R739 Hyperglycemia, unspecified: Secondary | ICD-10-CM

## 2014-11-28 DIAGNOSIS — K5669 Other intestinal obstruction: Secondary | ICD-10-CM

## 2014-11-28 LAB — GLUCOSE, CAPILLARY
GLUCOSE-CAPILLARY: 123 mg/dL — AB (ref 65–99)
GLUCOSE-CAPILLARY: 122 mg/dL — AB (ref 65–99)
GLUCOSE-CAPILLARY: 116 mg/dL — AB (ref 65–99)
GLUCOSE-CAPILLARY: 125 mg/dL — AB (ref 65–99)
Glucose-Capillary: 105 mg/dL — ABNORMAL HIGH (ref 65–99)
Glucose-Capillary: 125 mg/dL — ABNORMAL HIGH (ref 65–99)

## 2014-11-28 LAB — BASIC METABOLIC PANEL
Anion gap: 12 (ref 5–15)
BUN: 25 mg/dL — ABNORMAL HIGH (ref 6–20)
CO2: 27 mmol/L (ref 22–32)
Calcium: 8.8 mg/dL — ABNORMAL LOW (ref 8.9–10.3)
Chloride: 103 mmol/L (ref 101–111)
Creatinine, Ser: 1.14 mg/dL (ref 0.61–1.24)
GFR calc Af Amer: >60 mL/min (ref >60)
GFR calc non Af Amer: >60 mL/min (ref >60)
Glucose, Bld: 136 mg/dL — ABNORMAL HIGH (ref 65–99)
POTASSIUM: 3.9 mmol/L (ref 3.5–5.1)
Sodium: 142 mmol/L (ref 135–145)

## 2014-11-28 LAB — HEMOGLOBIN A1C
HEMOGLOBIN A1C: 6.5 % — AB (ref 4.8–5.6)
MEAN PLASMA GLUCOSE: 140 mg/dL

## 2014-11-28 LAB — CBC
HCT: 49.1 % (ref 39.0–52.0)
HEMOGLOBIN: 16.3 g/dL (ref 13.0–17.0)
MCH: 31.3 pg (ref 26.0–34.0)
MCHC: 33.2 g/dL (ref 30.0–36.0)
MCV: 94.2 fL (ref 78.0–100.0)
PLATELETS: 133 10*3/uL — AB (ref 150–400)
RBC: 5.21 MIL/uL (ref 4.22–5.81)
RDW: 13.3 % (ref 11.5–15.5)
WBC: 8.8 10*3/uL (ref 4.0–10.5)

## 2014-11-28 NOTE — Progress Notes (Signed)
Subjective: Passing gas.  Bowels starting to move.  No distension.  Objective: Vital signs in last 24 hours: Temp:  [97.7 F (36.5 C)-98.4 F (36.9 C)] 97.7 F (36.5 C) (05/14 0630) Pulse Rate:  [76-98] 76 (05/14 0630) Resp:  [16-18] 18 (05/14 0630) BP: (150-186)/(65-93) 186/89 mmHg (05/14 0630) SpO2:  [93 %-100 %] 97 % (05/14 0630) Last BM Date: 11/24/14  Intake/Output from previous day: 05/13 0701 - 05/14 0700 In: 1200 [I.V.:1200] Out: 2350 [Urine:300; Emesis/NG output:2050] Intake/Output this shift: Total I/O In: -  Out: 400 [Emesis/NG output:200; Other:200]  PE: General- In NAD Abdomen-soft, no tenderness or distension, multiple scars, few bowel sounds.  Lab Results:   Recent Labs  11/27/14 0435 11/28/14 0539  WBC 11.0* 8.8  HGB 16.8 16.3  HCT 50.3 49.1  PLT 144* 133*   BMET  Recent Labs  11/27/14 0435 11/28/14 0539  NA 140 142  K 3.7 3.9  CL 99* 103  CO2 28 27  GLUCOSE 142* 136*  BUN 21* 25*  CREATININE 1.16 1.14  CALCIUM 9.2 8.8*   PT/INR No results for input(s): LABPROT, INR in the last 72 hours. Comprehensive Metabolic Panel:    Component Value Date/Time   NA 142 11/28/2014 0539   NA 140 11/27/2014 0435   K 3.9 11/28/2014 0539   K 3.7 11/27/2014 0435   CL 103 11/28/2014 0539   CL 99* 11/27/2014 0435   CO2 27 11/28/2014 0539   CO2 28 11/27/2014 0435   BUN 25* 11/28/2014 0539   BUN 21* 11/27/2014 0435   CREATININE 1.14 11/28/2014 0539   CREATININE 1.16 11/27/2014 0435   GLUCOSE 136* 11/28/2014 0539   GLUCOSE 142* 11/27/2014 0435   CALCIUM 8.8* 11/28/2014 0539   CALCIUM 9.2 11/27/2014 0435   AST 21 11/27/2014 0435   AST 22 11/26/2014 1750   ALT 30 11/27/2014 0435   ALT 30 11/26/2014 1750   ALKPHOS 76 11/27/2014 0435   ALKPHOS 84 11/26/2014 1750   BILITOT 1.6* 11/27/2014 0435   BILITOT 1.3* 11/26/2014 1750   PROT 7.4 11/27/2014 0435   PROT 7.4 11/26/2014 1750   ALBUMIN 4.7 11/27/2014 0435   ALBUMIN 4.9 11/26/2014 1750      Studies/Results: Ct Abdomen Pelvis W Contrast  11/26/2014   CLINICAL DATA:  Generalized abdominal pain with nausea and vomiting for 1 day. Leukocytosis. History of partial bowel resection, cholecystectomy and diverticulitis. Initial encounter.  EXAM: CT ABDOMEN AND PELVIS WITH CONTRAST  TECHNIQUE: Multidetector CT imaging of the abdomen and pelvis was performed using the standard protocol following bolus administration of intravenous contrast.  CONTRAST:  12mL OMNIPAQUE IOHEXOL 300 MG/ML SOLN, 117mL OMNIPAQUE IOHEXOL 300 MG/ML SOLN  COMPARISON:  Abdominal pelvic CT 01/22/2012.  FINDINGS: Lower chest: Clear lung bases. No significant pleural or pericardial effusion.  Hepatobiliary: The liver is normal in density without focal abnormality. No significant biliary dilatation status post cholecystectomy)  Pancreas: Unremarkable. No pancreatic ductal dilatation or surrounding inflammatory changes.  Spleen: Normal in size without focal abnormality.  Adrenals/Urinary Tract: Both adrenal glands appear normal.2 mm calculus in the lower pole of the right kidney is best seen on coronal image number 70. No other urinary tract calculi demonstrated. There are probable tiny cyst in the lower pole of the right kidney. The left kidney appears normal. There is no hydronephrosis. No bladder abnormality seen.  Stomach/Bowel: There is a small hiatal hernia. The stomach is distended with fluid and contrast. The duodenum, jejunum and proximal ileum are moderately distended with contrast.  There is fecalization of the mid ileum proximal to an apparent focal transition point in the periumbilical area, best seen on axial image 60. The distal small bowel and colon are relatively decompressed. The appendix is not clearly seen. There is no evidence of pericecal inflammation.There is mesenteric edema with mild interloop fluid in the mid abdomen, best seen on coronal images 44-54. There is no focal extraluminal fluid collection,  extravasated enteric contrast or evidence of closed loop obstruction.  Vascular/Lymphatic: There are no enlarged abdominal or pelvic lymph nodes. There is moderate atherosclerosis of the aorta, its branches and the iliac arteries. No large vessel occlusion identified. No significant venous abnormalities.  Reproductive: Moderate enlargement of the prostate gland, measuring up to 6.7 cm transverse.  Other: There are probable postsurgical changes within the anterior abdominal wall. No recurrent hernia identified. The right rectus abdominus muscle is atrophied.  Musculoskeletal: No acute or significant osseous findings.  IMPRESSION: 1. High-grade mid small bowel obstruction, likely from adhesions. Focal transition point appears to be within the periumbilical region. Underlying fecalization of the small bowel proximal to the transition point implies some chronicity. 2. There is mesenteric edema and a small amount of fluid, but no focal abscess. 3. Nonobstructing right renal calculus. No evidence of ureteral calculus or hydronephrosis.   Electronically Signed   By: Richardean Sale M.D.   On: 11/26/2014 19:54   Dg Abd 2 Views  11/27/2014   CLINICAL DATA:  Small bowel obstruction.  EXAM: ABDOMEN - 2 VIEW  COMPARISON:  11/26/2014  FINDINGS: Persistent distended small bowel loops in left abdomen with air-fluid levels consistent with small bowel obstruction. NG tube in place with tip in mid stomach. Postcholecystectomy surgical clips. Contrast material from yesterday CT scan noted within urinary bladder.  IMPRESSION: Persistent distended small bowel loops in left abdomen with air-fluid levels consistent with small bowel obstruction. NG tube in place.   Electronically Signed   By: Lahoma Crocker M.D.   On: 11/27/2014 08:52   Dg Abd Portable 1v-small Bowel Obstruction Protocol-initial, 8 Hr Delay  11/27/2014   CLINICAL DATA:  Small-bowel obstruction protocol. 8 hour follow-up image.  EXAM: PORTABLE ABDOMEN - 1 VIEW   COMPARISON:  Abdominal radiograph performed earlier today at 11:22 a.m.  FINDINGS: Contrast is seen filling the colon. There is no evidence of small bowel obstruction, though there is still distention of small bowel loops at the left mid abdomen to 4.3 cm in maximal diameter. This may reflect some degree of residual small bowel dysmotility. No free intra-abdominal air is seen, though evaluation for free air is limited on supine views.  Clips are noted within the right upper quadrant, reflecting prior cholecystectomy. Mild degenerative change is noted at the lower lumbar spine. Contrast is seen partially filling the bladder.  IMPRESSION: Contrast noted filling the colon. No evidence of small bowel obstruction, though there is still distention of small bowel loops at the left mid abdomen to 4.3 cm in maximal diameter. This may reflect some degree of residual small bowel dysmotility. No free intra-abdominal air seen.   Electronically Signed   By: Garald Balding M.D.   On: 11/27/2014 19:10   Dg Abd Portable 1v-small Bowel Protocol-position Verification  11/27/2014   CLINICAL DATA:  Initial image for nasogastric tube placement. Gastrografin administered at 1044 hr, without following small bowel obstruction protocol.  EXAM: PORTABLE ABDOMEN - 1 VIEW  COMPARISON:  Earlier same day  FINDINGS: Nasogastric tube tip is in the gastric fundus. The gastric fundus is  opacified with contrast, unknown amount. Gas pattern is unremarkable on this limited image.  IMPRESSION: Nasogastric tube tip in the gastric fundus. The fundus is full of contrast. As noted in the technologist notes, protocol was not followed with respect to decompression time.   Electronically Signed   By: Nelson Chimes M.D.   On: 11/27/2014 12:02    Anti-infectives: Anti-infectives    None      Assessment Principal Problem:   SBO (small bowel obstruction)-contrast goes through to colon on SBO protocol x-ray; clinically improved as well    LOS: 2 days    Plan: Agree with ng clamping trial today and ice chips.   Camay Pedigo J 11/28/2014

## 2014-11-28 NOTE — Progress Notes (Signed)
[facility] PROGRESS NOTE Assessment/Plan: SBO (small bowel obstruction)  - Clamp NG tube, may have ICe chips. -  appreciate surgery assistance.   Hyperglycemia - A1c 6.5 cont SSI    Code Status: full Family Communication: none  Disposition Plan: home 2-3 days   Consultants:  surgery  Procedures:  Ct abd  Antibiotics:  noen  HPI/Subjective: No complains, Had BM this am. Passing gas  Objective: Filed Vitals:   11/27/14 0619 11/27/14 1400 11/27/14 2125 11/28/14 0630  BP: 160/91 150/65 156/93 186/89  Pulse: 94 95 98 76  Temp: 97.9 F (36.6 C) 98.4 F (36.9 C) 97.8 F (36.6 C) 97.7 F (36.5 C)  TempSrc: Oral Oral Oral Oral  Resp: 16 16 18 18   Height:      Weight:      SpO2: 94% 100% 93% 97%    Intake/Output Summary (Last 24 hours) at 11/28/14 1041 Last data filed at 11/28/14 1001  Gross per 24 hour  Intake   1000 ml  Output   2100 ml  Net  -1100 ml   Filed Weights   11/26/14 1728 11/26/14 2228  Weight: 106.142 kg (234 lb) 106.595 kg (235 lb)    Exam:  General: Alert, awake, oriented x3, in no acute distress.  HEENT: No bruits, no goiter.  Heart: Regular rate and rhythm. Lungs: Good air movement, clear Abdomen: Soft, nontender, nondistended, positive bowel sounds.  Neuro: Grossly intact, nonfocal.   Data Reviewed: Basic Metabolic Panel:  Recent Labs Lab 11/26/14 1750 11/27/14 0435 11/28/14 0539  NA 138 140 142  K 3.8 3.7 3.9  CL 103 99* 103  CO2 23 28 27   GLUCOSE 169* 142* 136*  BUN 21* 21* 25*  CREATININE 1.24 1.16 1.14  CALCIUM 9.8 9.2 8.8*   Liver Function Tests:  Recent Labs Lab 11/26/14 1750 11/27/14 0435  AST 22 21  ALT 30 30  ALKPHOS 84 76  BILITOT 1.3* 1.6*  PROT 7.4 7.4  ALBUMIN 4.9 4.7    Recent Labs Lab 11/26/14 1750  LIPASE 23   No results for input(s): AMMONIA in the last 168 hours. CBC:  Recent Labs Lab 11/26/14 1750 11/27/14 0435 11/28/14 0539  WBC 13.0* 11.0* 8.8  NEUTROABS 10.8*   --   --   HGB 18.0* 16.8 16.3  HCT 52.1* 50.3 49.1  MCV 90.6 93.5 94.2  PLT 153 144* 133*   Cardiac Enzymes: No results for input(s): CKTOTAL, CKMB, CKMBINDEX, TROPONINI in the last 168 hours. BNP (last 3 results) No results for input(s): BNP in the last 8760 hours.  ProBNP (last 3 results) No results for input(s): PROBNP in the last 8760 hours.  CBG:  Recent Labs Lab 11/27/14 1537 11/27/14 2019 11/28/14 0001 11/28/14 0408 11/28/14 0755  GLUCAP 117* 120* 125* 123* 122*    No results found for this or any previous visit (from the past 240 hour(s)).   Studies: Ct Abdomen Pelvis W Contrast  11/26/2014   CLINICAL DATA:  Generalized abdominal pain with nausea and vomiting for 1 day. Leukocytosis. History of partial bowel resection, cholecystectomy and diverticulitis. Initial encounter.  EXAM: CT ABDOMEN AND PELVIS WITH CONTRAST  TECHNIQUE: Multidetector CT imaging of the abdomen and pelvis was performed using the standard protocol following bolus administration of intravenous contrast.  CONTRAST:  64mL OMNIPAQUE IOHEXOL 300 MG/ML SOLN, 118mL OMNIPAQUE IOHEXOL 300 MG/ML SOLN  COMPARISON:  Abdominal pelvic CT 01/22/2012.  FINDINGS: Lower chest: Clear lung bases. No significant pleural or pericardial effusion.  Hepatobiliary: The  liver is normal in density without focal abnormality. No significant biliary dilatation status post cholecystectomy)  Pancreas: Unremarkable. No pancreatic ductal dilatation or surrounding inflammatory changes.  Spleen: Normal in size without focal abnormality.  Adrenals/Urinary Tract: Both adrenal glands appear normal.2 mm calculus in the lower pole of the right kidney is best seen on coronal image number 70. No other urinary tract calculi demonstrated. There are probable tiny cyst in the lower pole of the right kidney. The left kidney appears normal. There is no hydronephrosis. No bladder abnormality seen.  Stomach/Bowel: There is a small hiatal hernia. The stomach  is distended with fluid and contrast. The duodenum, jejunum and proximal ileum are moderately distended with contrast. There is fecalization of the mid ileum proximal to an apparent focal transition point in the periumbilical area, best seen on axial image 60. The distal small bowel and colon are relatively decompressed. The appendix is not clearly seen. There is no evidence of pericecal inflammation.There is mesenteric edema with mild interloop fluid in the mid abdomen, best seen on coronal images 44-54. There is no focal extraluminal fluid collection, extravasated enteric contrast or evidence of closed loop obstruction.  Vascular/Lymphatic: There are no enlarged abdominal or pelvic lymph nodes. There is moderate atherosclerosis of the aorta, its branches and the iliac arteries. No large vessel occlusion identified. No significant venous abnormalities.  Reproductive: Moderate enlargement of the prostate gland, measuring up to 6.7 cm transverse.  Other: There are probable postsurgical changes within the anterior abdominal wall. No recurrent hernia identified. The right rectus abdominus muscle is atrophied.  Musculoskeletal: No acute or significant osseous findings.  IMPRESSION: 1. High-grade mid small bowel obstruction, likely from adhesions. Focal transition point appears to be within the periumbilical region. Underlying fecalization of the small bowel proximal to the transition point implies some chronicity. 2. There is mesenteric edema and a small amount of fluid, but no focal abscess. 3. Nonobstructing right renal calculus. No evidence of ureteral calculus or hydronephrosis.   Electronically Signed   By: Richardean Sale M.D.   On: 11/26/2014 19:54   Dg Abd 2 Views  11/27/2014   CLINICAL DATA:  Small bowel obstruction.  EXAM: ABDOMEN - 2 VIEW  COMPARISON:  11/26/2014  FINDINGS: Persistent distended small bowel loops in left abdomen with air-fluid levels consistent with small bowel obstruction. NG tube in place  with tip in mid stomach. Postcholecystectomy surgical clips. Contrast material from yesterday CT scan noted within urinary bladder.  IMPRESSION: Persistent distended small bowel loops in left abdomen with air-fluid levels consistent with small bowel obstruction. NG tube in place.   Electronically Signed   By: Lahoma Crocker M.D.   On: 11/27/2014 08:52   Dg Abd Portable 1v-small Bowel Obstruction Protocol-initial, 8 Hr Delay  11/27/2014   CLINICAL DATA:  Small-bowel obstruction protocol. 8 hour follow-up image.  EXAM: PORTABLE ABDOMEN - 1 VIEW  COMPARISON:  Abdominal radiograph performed earlier today at 11:22 a.m.  FINDINGS: Contrast is seen filling the colon. There is no evidence of small bowel obstruction, though there is still distention of small bowel loops at the left mid abdomen to 4.3 cm in maximal diameter. This may reflect some degree of residual small bowel dysmotility. No free intra-abdominal air is seen, though evaluation for free air is limited on supine views.  Clips are noted within the right upper quadrant, reflecting prior cholecystectomy. Mild degenerative change is noted at the lower lumbar spine. Contrast is seen partially filling the bladder.  IMPRESSION: Contrast noted filling  the colon. No evidence of small bowel obstruction, though there is still distention of small bowel loops at the left mid abdomen to 4.3 cm in maximal diameter. This may reflect some degree of residual small bowel dysmotility. No free intra-abdominal air seen.   Electronically Signed   By: Garald Balding M.D.   On: 11/27/2014 19:10   Dg Abd Portable 1v-small Bowel Protocol-position Verification  11/27/2014   CLINICAL DATA:  Initial image for nasogastric tube placement. Gastrografin administered at 1044 hr, without following small bowel obstruction protocol.  EXAM: PORTABLE ABDOMEN - 1 VIEW  COMPARISON:  Earlier same day  FINDINGS: Nasogastric tube tip is in the gastric fundus. The gastric fundus is opacified with  contrast, unknown amount. Gas pattern is unremarkable on this limited image.  IMPRESSION: Nasogastric tube tip in the gastric fundus. The fundus is full of contrast. As noted in the technologist notes, protocol was not followed with respect to decompression time.   Electronically Signed   By: Nelson Chimes M.D.   On: 11/27/2014 12:02    Scheduled Meds: . insulin aspart  0-9 Units Subcutaneous 6 times per day   Continuous Infusions: . sodium chloride 50 mL/hr at 11/28/14 1032    Time Spent: 25 min   Charlynne Cousins  [facility] Pager [phone]. If 7PM-7AM, please contact night-coverage at [email or web address], password [facility] 11/28/2014, 10:41 AM  LOS: 2 days

## 2014-11-29 DIAGNOSIS — E119 Type 2 diabetes mellitus without complications: Secondary | ICD-10-CM

## 2014-11-29 LAB — BASIC METABOLIC PANEL
ANION GAP: 12 (ref 5–15)
BUN: 23 mg/dL — AB (ref 6–20)
CHLORIDE: 106 mmol/L (ref 101–111)
CO2: 25 mmol/L (ref 22–32)
Calcium: 8.7 mg/dL — ABNORMAL LOW (ref 8.9–10.3)
Creatinine, Ser: 1 mg/dL (ref 0.61–1.24)
GFR calc Af Amer: >60 mL/min (ref >60)
GFR calc non Af Amer: >60 mL/min (ref >60)
Glucose, Bld: 129 mg/dL — ABNORMAL HIGH (ref 65–99)
Potassium: 3.7 mmol/L (ref 3.5–5.1)
Sodium: 143 mmol/L (ref 135–145)

## 2014-11-29 LAB — CBC
HEMATOCRIT: 47 % (ref 39.0–52.0)
HEMOGLOBIN: 15.4 g/dL (ref 13.0–17.0)
MCH: 30.8 pg (ref 26.0–34.0)
MCHC: 32.8 g/dL (ref 30.0–36.0)
MCV: 94 fL (ref 78.0–100.0)
Platelets: 134 10*3/uL — ABNORMAL LOW (ref 150–400)
RBC: 5 MIL/uL (ref 4.22–5.81)
RDW: 13.1 % (ref 11.5–15.5)
WBC: 8.3 10*3/uL (ref 4.0–10.5)

## 2014-11-29 LAB — GLUCOSE, CAPILLARY
GLUCOSE-CAPILLARY: 115 mg/dL — AB (ref 65–99)
GLUCOSE-CAPILLARY: 118 mg/dL — AB (ref 65–99)
GLUCOSE-CAPILLARY: 130 mg/dL — AB (ref 65–99)
GLUCOSE-CAPILLARY: 140 mg/dL — AB (ref 65–99)
GLUCOSE-CAPILLARY: 89 mg/dL (ref 65–99)
Glucose-Capillary: 123 mg/dL — ABNORMAL HIGH (ref 65–99)

## 2014-11-29 MED ORDER — METFORMIN HCL 500 MG PO TABS
500.0000 mg | ORAL_TABLET | Freq: Two times a day (BID) | ORAL | Status: DC
  Administered 2014-11-29 – 2014-11-30 (×2): 500 mg via ORAL
  Filled 2014-11-29 (×4): qty 1

## 2014-11-29 NOTE — Progress Notes (Addendum)
[facility] PROGRESS NOTE Assessment/Plan: SBO (small bowel obstruction)  - D/c NG tube, start clear advance as tolerate it. -  appreciate surgery assistance.  Controlled DM mellitus type 2: - A1c 6.5 cont SSI. - Start Metformin.  Code Status: full Family Communication: none  Disposition Plan: home 1-2 days   Consultants:  surgery  Procedures:  Ct abd  Antibiotics:  noen  HPI/Subjective: Having BM this am. Passing gas  Objective: Filed Vitals:   11/28/14 1350 11/28/14 2147 11/29/14 0205 11/29/14 0619  BP: 185/86 174/94 130/64 177/87  Pulse: 82 91 94 74  Temp: 98.2 F (36.8 C) 98.2 F (36.8 C) 98.4 F (36.9 C) 97.9 F (36.6 C)  TempSrc: Oral Oral Oral Oral  Resp: 20 20 18 18   Height:      Weight:      SpO2: 96% 98% 95% 95%    Intake/Output Summary (Last 24 hours) at 11/29/14 0920 Last data filed at 11/29/14 0600  Gross per 24 hour  Intake   1260 ml  Output   1275 ml  Net    -15 ml   Filed Weights   11/26/14 1728 11/26/14 2228  Weight: 106.142 kg (234 lb) 106.595 kg (235 lb)    Exam:  General: Alert, awake, oriented x3, in no acute distress.  HEENT: No bruits, no goiter.  Heart: Regular rate and rhythm. Lungs: Good air movement, clear Abdomen: Soft, nontender, nondistended, positive bowel sounds.  Neuro: Grossly intact, nonfocal.   Data Reviewed: Basic Metabolic Panel:  Recent Labs Lab 11/26/14 1750 11/27/14 0435 11/28/14 0539 11/29/14 0515  NA 138 140 142 143  K 3.8 3.7 3.9 3.7  CL 103 99* 103 106  CO2 23 28 27 25   GLUCOSE 169* 142* 136* 129*  BUN 21* 21* 25* 23*  CREATININE 1.24 1.16 1.14 1.00  CALCIUM 9.8 9.2 8.8* 8.7*   Liver Function Tests:  Recent Labs Lab 11/26/14 1750 11/27/14 0435  AST 22 21  ALT 30 30  ALKPHOS 84 76  BILITOT 1.3* 1.6*  PROT 7.4 7.4  ALBUMIN 4.9 4.7    Recent Labs Lab 11/26/14 1750  LIPASE 23   No results for input(s): AMMONIA in the last 168 hours. CBC:  Recent Labs Lab  11/26/14 1750 11/27/14 0435 11/28/14 0539 11/29/14 0515  WBC 13.0* 11.0* 8.8 8.3  NEUTROABS 10.8*  --   --   --   HGB 18.0* 16.8 16.3 15.4  HCT 52.1* 50.3 49.1 47.0  MCV 90.6 93.5 94.2 94.0  PLT 153 144* 133* 134*   Cardiac Enzymes: No results for input(s): CKTOTAL, CKMB, CKMBINDEX, TROPONINI in the last 168 hours. BNP (last 3 results) No results for input(s): BNP in the last 8760 hours.  ProBNP (last 3 results) No results for input(s): PROBNP in the last 8760 hours.  CBG:  Recent Labs Lab 11/28/14 1621 11/28/14 1959 11/29/14 0002 11/29/14 0409 11/29/14 0829  GLUCAP 105* 125* 130* 115* 123*    No results found for this or any previous visit (from the past 240 hour(s)).   Studies: Dg Abd Portable 1v-small Bowel Obstruction Protocol-initial, 8 Hr Delay  11/27/2014   CLINICAL DATA:  Small-bowel obstruction protocol. 8 hour follow-up image.  EXAM: PORTABLE ABDOMEN - 1 VIEW  COMPARISON:  Abdominal radiograph performed earlier today at 11:22 a.m.  FINDINGS: Contrast is seen filling the colon. There is no evidence of small bowel obstruction, though there is still distention of small bowel loops at the left mid abdomen to 4.3 cm  in maximal diameter. This may reflect some degree of residual small bowel dysmotility. No free intra-abdominal air is seen, though evaluation for free air is limited on supine views.  Clips are noted within the right upper quadrant, reflecting prior cholecystectomy. Mild degenerative change is noted at the lower lumbar spine. Contrast is seen partially filling the bladder.  IMPRESSION: Contrast noted filling the colon. No evidence of small bowel obstruction, though there is still distention of small bowel loops at the left mid abdomen to 4.3 cm in maximal diameter. This may reflect some degree of residual small bowel dysmotility. No free intra-abdominal air seen.   Electronically Signed   By: Garald Balding M.D.   On: 11/27/2014 19:10   Dg Abd Portable 1v-small  Bowel Protocol-position Verification  11/27/2014   CLINICAL DATA:  Initial image for nasogastric tube placement. Gastrografin administered at 1044 hr, without following small bowel obstruction protocol.  EXAM: PORTABLE ABDOMEN - 1 VIEW  COMPARISON:  Earlier same day  FINDINGS: Nasogastric tube tip is in the gastric fundus. The gastric fundus is opacified with contrast, unknown amount. Gas pattern is unremarkable on this limited image.  IMPRESSION: Nasogastric tube tip in the gastric fundus. The fundus is full of contrast. As noted in the technologist notes, protocol was not followed with respect to decompression time.   Electronically Signed   By: Nelson Chimes M.D.   On: 11/27/2014 12:02    Scheduled Meds: . insulin aspart  0-9 Units Subcutaneous 6 times per day   Continuous Infusions: . sodium chloride 50 mL/hr at 11/29/14 0636    Time Spent: 25 min   Charlynne Cousins  [facility] Pager [phone]. If 7PM-7AM, please contact night-coverage at [email or web address], password [facility] 11/29/2014, 9:20 AM  LOS: 3 days

## 2014-11-29 NOTE — Progress Notes (Signed)
  Subjective: Having BM's NG now out and tolerated the liquids he just had  Objective: Vital signs in last 24 hours: Temp:  [97.9 F (36.6 C)-98.4 F (36.9 C)] 97.9 F (36.6 C) (05/15 0619) Pulse Rate:  [74-94] 74 (05/15 0619) Resp:  [18-20] 18 (05/15 0619) BP: (130-185)/(64-94) 177/87 mmHg (05/15 0619) SpO2:  [95 %-98 %] 95 % (05/15 0619) Last BM Date: 11/29/14  Intake/Output from previous day: 05/14 0701 - 05/15 0700 In: 1260 [P.O.:60; I.V.:1200] Out: 1275 [Emesis/NG output:300] Intake/Output this shift: Total I/O In: 420 [P.O.:420] Out: -   Abdomen soft, non tender, non distended  Lab Results:   Recent Labs  11/28/14 0539 11/29/14 0515  WBC 8.8 8.3  HGB 16.3 15.4  HCT 49.1 47.0  PLT 133* 134*   BMET  Recent Labs  11/28/14 0539 11/29/14 0515  NA 142 143  K 3.9 3.7  CL 103 106  CO2 27 25  GLUCOSE 136* 129*  BUN 25* 23*  CREATININE 1.14 1.00  CALCIUM 8.8* 8.7*   PT/INR No results for input(s): LABPROT, INR in the last 72 hours. ABG No results for input(s): PHART, HCO3 in the last 72 hours.  Invalid input(s): PCO2, PO2  Studies/Results: Dg Abd Portable 1v-small Bowel Obstruction Protocol-initial, 8 Hr Delay  11/27/2014   CLINICAL DATA:  Small-bowel obstruction protocol. 8 hour follow-up image.  EXAM: PORTABLE ABDOMEN - 1 VIEW  COMPARISON:  Abdominal radiograph performed earlier today at 11:22 a.m.  FINDINGS: Contrast is seen filling the colon. There is no evidence of small bowel obstruction, though there is still distention of small bowel loops at the left mid abdomen to 4.3 cm in maximal diameter. This may reflect some degree of residual small bowel dysmotility. No free intra-abdominal air is seen, though evaluation for free air is limited on supine views.  Clips are noted within the right upper quadrant, reflecting prior cholecystectomy. Mild degenerative change is noted at the lower lumbar spine. Contrast is seen partially filling the bladder.   IMPRESSION: Contrast noted filling the colon. No evidence of small bowel obstruction, though there is still distention of small bowel loops at the left mid abdomen to 4.3 cm in maximal diameter. This may reflect some degree of residual small bowel dysmotility. No free intra-abdominal air seen.   Electronically Signed   By: Garald Balding M.D.   On: 11/27/2014 19:10    Anti-infectives: Anti-infectives    None      Assessment/Plan:  SBO  Seems to be resolving.  Advance as tolerated  LOS: 3 days    Tiburcio Linder A 11/29/2014

## 2014-11-30 ENCOUNTER — Encounter (HOSPITAL_COMMUNITY): Payer: Self-pay | Admitting: General Surgery

## 2014-11-30 DIAGNOSIS — E119 Type 2 diabetes mellitus without complications: Secondary | ICD-10-CM

## 2014-11-30 LAB — CBC
HEMATOCRIT: 43.4 % (ref 39.0–52.0)
HEMOGLOBIN: 14.1 g/dL (ref 13.0–17.0)
MCH: 30.6 pg (ref 26.0–34.0)
MCHC: 32.5 g/dL (ref 30.0–36.0)
MCV: 94.1 fL (ref 78.0–100.0)
PLATELETS: 132 10*3/uL — AB (ref 150–400)
RBC: 4.61 MIL/uL (ref 4.22–5.81)
RDW: 13.1 % (ref 11.5–15.5)
WBC: 6.5 10*3/uL (ref 4.0–10.5)

## 2014-11-30 LAB — BASIC METABOLIC PANEL
Anion gap: 8 (ref 5–15)
BUN: 19 mg/dL (ref 6–20)
CALCIUM: 8.8 mg/dL — AB (ref 8.9–10.3)
CHLORIDE: 106 mmol/L (ref 101–111)
CO2: 28 mmol/L (ref 22–32)
Creatinine, Ser: 1.12 mg/dL (ref 0.61–1.24)
GFR calc Af Amer: >60 mL/min (ref >60)
Glucose, Bld: 118 mg/dL — ABNORMAL HIGH (ref 65–99)
POTASSIUM: 4.1 mmol/L (ref 3.5–5.1)
SODIUM: 142 mmol/L (ref 135–145)

## 2014-11-30 LAB — GLUCOSE, CAPILLARY: Glucose-Capillary: 121 mg/dL — ABNORMAL HIGH (ref 65–99)

## 2014-11-30 MED ORDER — METFORMIN HCL 500 MG PO TABS: 500.0000 mg | ORAL_TABLET | Freq: Two times a day (BID) | ORAL | Status: AC

## 2014-11-30 NOTE — Progress Notes (Signed)
Patient ID: Tanner [name], male   DOB: 24-Sep-1945, 69 y.o.   MRN: [id number]     [facility]      [address] [place], [place] [address]    Phone: [phone] FAX: [phone]     Subjective: No issues. BM this AM.    Objective:  Vital signs:  Filed Vitals:   11/29/14 0619 11/29/14 1418 11/29/14 2135 11/30/14 0515  BP: 177/87 143/79 167/72 149/85  Pulse: 74 78 78 65  Temp: 97.9 F (36.6 C) 98.8 F (37.1 C) 98 F (36.7 C) 98.2 F (36.8 C)  TempSrc: Oral Oral Oral Oral  Resp: _0 Height:      Weight:      SpO2: 95% 97% 97% 96%    Last BM Date: 11/29/14  Intake/Output   Yesterday:  05/15 0701 - 05/16 0700 In: 1025 [P.O.:420; I.V.:1200] Out: 650 [Urine:300] This shift:  Total I/O In: -  Out: 250 [Urine:250]   Physical Exam: General: Pt awake/alert/oriented x4 in no acute distress Abdomen: Soft.  Nondistended.  Non tender.  No evidence of peritonitis.  No incarcerated hernias.    Problem List:   Principal Problem:   SBO (small bowel obstruction) Active Problems:   Diverticulosis of colon without hemorrhage   Hyperglycemia   Controlled type 2 diabetes mellitus without complication    Results:   Labs: Results for orders placed or performed during the hospital encounter of 11/26/14 (from the past 48 hour(s))  Glucose, capillary     Status: Abnormal   Collection Time: 11/28/14 12:18 PM  Result Value Ref Range   Glucose-Capillary 116 (H) 65 - 99 mg/dL  Glucose, capillary     Status: Abnormal   Collection Time: 11/28/14  4:21 PM  Result Value Ref Range   Glucose-Capillary 105 (H) 65 - 99 mg/dL  Glucose, capillary     Status: Abnormal   Collection Time: 11/28/14  7:59 PM  Result Value Ref Range   Glucose-Capillary 125 (H) 65 - 99 mg/dL  Glucose, capillary     Status: Abnormal   Collection Time: 11/29/14 12:02 AM  Result Value Ref Range   Glucose-Capillary 130 (H) 65 - 99 mg/dL   Glucose, capillary     Status: Abnormal   Collection Time: 11/29/14  4:09 AM  Result Value Ref Range   Glucose-Capillary 115 (H) 65 - 99 mg/dL  Basic metabolic panel     Status: Abnormal   Collection Time: 11/29/14  5:15 AM  Result Value Ref Range   Sodium 143 135 - 145 mmol/L   Potassium 3.7 3.5 - 5.1 mmol/L   Chloride 106 101 - 111 mmol/L   CO2 25 22 - 32 mmol/L   Glucose, Bld 129 (H) 65 - 99 mg/dL   BUN 23 (H) 6 - 20 mg/dL   Creatinine, Ser 1.00 0.61 - 1.24 mg/dL   Calcium 8.7 (L) 8.9 - 10.3 mg/dL   GFR calc non Af Amer >60 >60 mL/min   GFR calc Af Amer >60 >60 mL/min    Comment: (NOTE) The eGFR has been calculated using the CKD EPI equation. This calculation has not been validated in all clinical situations. eGFR's persistently <60 mL/min signify possible Chronic Kidney Disease.    Anion gap 12 5 - 15  CBC     Status: Abnormal   Collection Time: 11/29/14  5:15 AM  Result Value Ref Range   WBC 8.3 4.0 - 10.5 K/uL  RBC 5.00 4.22 - 5.81 MIL/uL   Hemoglobin 15.4 13.0 - 17.0 g/dL   HCT 47.0 39.0 - 52.0 %   MCV 94.0 78.0 - 100.0 fL   MCH 30.8 26.0 - 34.0 pg   MCHC 32.8 30.0 - 36.0 g/dL   RDW 13.1 11.5 - 15.5 %   Platelets 134 (L) 150 - 400 K/uL  Glucose, capillary     Status: Abnormal   Collection Time: 11/29/14  8:29 AM  Result Value Ref Range   Glucose-Capillary 123 (H) 65 - 99 mg/dL  Glucose, capillary     Status: Abnormal   Collection Time: 11/29/14 12:46 PM  Result Value Ref Range   Glucose-Capillary 118 (H) 65 - 99 mg/dL  Glucose, capillary     Status: Abnormal   Collection Time: 11/29/14  4:49 PM  Result Value Ref Range   Glucose-Capillary 140 (H) 65 - 99 mg/dL  Glucose, capillary     Status: None   Collection Time: 11/29/14  9:36 PM  Result Value Ref Range   Glucose-Capillary 89 65 - 99 mg/dL  Basic metabolic panel     Status: Abnormal   Collection Time: 11/30/14  5:11 AM  Result Value Ref Range   Sodium 142 135 - 145 mmol/L   Potassium 4.1 3.5 - 5.1  mmol/L   Chloride 106 101 - 111 mmol/L   CO2 28 22 - 32 mmol/L   Glucose, Bld 118 (H) 65 - 99 mg/dL   BUN 19 6 - 20 mg/dL   Creatinine, Ser 1.12 0.61 - 1.24 mg/dL   Calcium 8.8 (L) 8.9 - 10.3 mg/dL   GFR calc non Af Amer >60 >60 mL/min   GFR calc Af Amer >60 >60 mL/min    Comment: (NOTE) The eGFR has been calculated using the CKD EPI equation. This calculation has not been validated in all clinical situations. eGFR's persistently <60 mL/min signify possible Chronic Kidney Disease.    Anion gap 8 5 - 15  CBC     Status: Abnormal   Collection Time: 11/30/14  5:11 AM  Result Value Ref Range   WBC 6.5 4.0 - 10.5 K/uL   RBC 4.61 4.22 - 5.81 MIL/uL   Hemoglobin 14.1 13.0 - 17.0 g/dL   HCT 43.4 39.0 - 52.0 %   MCV 94.1 78.0 - 100.0 fL   MCH 30.6 26.0 - 34.0 pg   MCHC 32.5 30.0 - 36.0 g/dL   RDW 13.1 11.5 - 15.5 %   Platelets 132 (L) 150 - 400 K/uL  Glucose, capillary     Status: Abnormal   Collection Time: 11/30/14  8:13 AM  Result Value Ref Range   Glucose-Capillary 121 (H) 65 - 99 mg/dL    Imaging / Studies: No results found.  Medications / Allergies:  Scheduled Meds: . metFORMIN  500 mg Oral BID WC   Continuous Infusions: . sodium chloride 50 mL/hr at 11/30/14 0511   PRN Meds:.acetaminophen **OR** acetaminophen, morphine injection, ondansetron **OR** ondansetron (ZOFRAN) IV  Antibiotics: Anti-infectives    None        Assessment/Plan SBO-tolerating POs, having BMs, abd pain resolved.  Stable for DC From surgical standpoint.   Erby Pian, [facility] Surgery Pager [phone])   11/30/2014 9:02 AM

## 2014-11-30 NOTE — Discharge Summary (Signed)
Physician Discharge Summary  Tanner [name]:[id number] DOB: 06/24/1946 DOA: 11/26/2014  PCP: Gara Kroner, MD  Admit date: 11/26/2014 Discharge date: 11/30/2014  Time spent: 20 minutes  Recommendations for Outpatient Follow-up:  1. Follow up with Surgery as an outpatient for SBO follow up. 2. PCP in 2-4 week Newly diagnose DM follow up    Discharge Diagnoses:  Principal Problem:   SBO (small bowel obstruction) Active Problems:   Diverticulosis of colon without hemorrhage   Hyperglycemia   Controlled type 2 diabetes mellitus without complication   Discharge Condition: stable  Diet recommendation: carb modified  Filed Weights   11/26/14 1728 11/26/14 2228  Weight: 106.142 kg (234 lb) 106.595 kg (235 lb)    History of present illness:  69 y.o. male with prior history of diverticulitis and s/p colon resection came to [facility] with possible bowel obstruction. A CT scan which was done shows presence of high grade mid small bowel obstruction which may be chronic by appearance on the CT. He staets that he got up yesterday and was having pain all over his abdomen. Patient states he went to the urgent care and had a abdominal xray done which was suspicuous for obstruction. He was referred for a CT scan of the abdomen and this shows presence of possible bowel obstruction. He staets that he has had vomiting yesterday and today. He states the does feel a little better with the NG tube. He states in 1987 he had a perforation which is what lead to surgery and a temporary colostomy which was later taken down. He has no fevers or chills noted. He states that he has been having sweating when he had the nausea. He has no chest pain. He has no history of Diabetes or HTN. He states he recently had a physical exam and everything checked out OK as far as his labs etc.  Hospital Course:  SBO (small bowel obstruction): - Surgery consulted, treated conservatively with IV fluids and NG tube at  intermittent suction with resolution of symptoms. - diet advance and passing BM -Appreciate surgery assistance.  Newly diagnosed DM mellitus type 2: - A1c 6.5 cont SSI. - Start Metformin.  Procedures:  CT abd and pelvis  Consultations:  Surgery  Discharge Exam: Filed Vitals:   11/30/14 0515  BP: 149/85  Pulse: 65  Temp: 98.2 F (36.8 C)  Resp: 16    General: A&O x3 Cardiovascular: RRR Respiratory: good air movement CTA B/L  Discharge Instructions   Discharge Instructions    Diet - low sodium heart healthy    Complete by:  As directed      Increase activity slowly    Complete by:  As directed           Current Discharge Medication List    START taking these medications   Details  metFORMIN (GLUCOPHAGE) 500 MG tablet Take 1 tablet (500 mg total) by mouth 2 (two) times daily with a meal. Qty: 60 tablet, Refills: 3       No Known Allergies Follow-up Information    Follow up with Gara Kroner, MD.   Specialty:  Family Medicine   Contact information:   [address] [place] [id number] [phone]        The results of significant diagnostics from this hospitalization (including imaging, microbiology, ancillary and laboratory) are listed below for reference.    Significant Diagnostic Studies: Ct Abdomen Pelvis W Contrast  11/26/2014   CLINICAL DATA:  Generalized abdominal pain  with nausea and vomiting for 1 day. Leukocytosis. History of partial bowel resection, cholecystectomy and diverticulitis. Initial encounter.  EXAM: CT ABDOMEN AND PELVIS WITH CONTRAST  TECHNIQUE: Multidetector CT imaging of the abdomen and pelvis was performed using the standard protocol following bolus administration of intravenous contrast.  CONTRAST:  66mL OMNIPAQUE IOHEXOL 300 MG/ML SOLN, 144mL OMNIPAQUE IOHEXOL 300 MG/ML SOLN  COMPARISON:  Abdominal pelvic CT 01/22/2012.  FINDINGS: Lower chest: Clear lung bases. No significant pleural or pericardial  effusion.  Hepatobiliary: The liver is normal in density without focal abnormality. No significant biliary dilatation status post cholecystectomy)  Pancreas: Unremarkable. No pancreatic ductal dilatation or surrounding inflammatory changes.  Spleen: Normal in size without focal abnormality.  Adrenals/Urinary Tract: Both adrenal glands appear normal.2 mm calculus in the lower pole of the right kidney is best seen on coronal image number 70. No other urinary tract calculi demonstrated. There are probable tiny cyst in the lower pole of the right kidney. The left kidney appears normal. There is no hydronephrosis. No bladder abnormality seen.  Stomach/Bowel: There is a small hiatal hernia. The stomach is distended with fluid and contrast. The duodenum, jejunum and proximal ileum are moderately distended with contrast. There is fecalization of the mid ileum proximal to an apparent focal transition point in the periumbilical area, best seen on axial image 60. The distal small bowel and colon are relatively decompressed. The appendix is not clearly seen. There is no evidence of pericecal inflammation.There is mesenteric edema with mild interloop fluid in the mid abdomen, best seen on coronal images 44-54. There is no focal extraluminal fluid collection, extravasated enteric contrast or evidence of closed loop obstruction.  Vascular/Lymphatic: There are no enlarged abdominal or pelvic lymph nodes. There is moderate atherosclerosis of the aorta, its branches and the iliac arteries. No large vessel occlusion identified. No significant venous abnormalities.  Reproductive: Moderate enlargement of the prostate gland, measuring up to 6.7 cm transverse.  Other: There are probable postsurgical changes within the anterior abdominal wall. No recurrent hernia identified. The right rectus abdominus muscle is atrophied.  Musculoskeletal: No acute or significant osseous findings.  IMPRESSION: 1. High-grade mid small bowel obstruction,  likely from adhesions. Focal transition point appears to be within the periumbilical region. Underlying fecalization of the small bowel proximal to the transition point implies some chronicity. 2. There is mesenteric edema and a small amount of fluid, but no focal abscess. 3. Nonobstructing right renal calculus. No evidence of ureteral calculus or hydronephrosis.   Electronically Signed   By: Richardean Sale M.D.   On: 11/26/2014 19:54   [place] Screening Aaa  11/13/2014   CLINICAL DATA:  Former smoker.  Abdominal aortic aneurysm screening.  EXAM: ULTRASOUND OF ABDOMINAL AORTA  TECHNIQUE: Ultrasound examination of the abdominal aorta was performed to evaluate for abdominal aortic aneurysm.  COMPARISON:  CT abdomen pelvis - 01/22/2012  FINDINGS: Examination is degraded due to patient body habitus and overlying bowel gas.  Abdominal Aorta  There is mild fusiform ectasia of the mid aspect of the abdominal aorta.  Proximal:  2.9 x 2.9 cm.  Mid:  2.9 x 2.9 cm.  Distal:  2.1 x 2.4 cm.  Right common iliac artery:  Normal in size measuring 1.6 x 1.8 cm.  Left Common iliac artery:  Normal in size measuring 1.4 x 1.7 cm.  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 As such, a followup by ultrasound  in 5 years is recommended. This recommendation follows ACR consensus guidelines: White Paper of the ACR Incidental Findings Committee II on Vascular Findings. J Am Coll Radiol 2013; 10:789-794.   Electronically Signed   By: Sandi Mariscal M.D.   On: 11/13/2014 08:23   Dg Abd 2 Views  11/27/2014   CLINICAL DATA:  Small bowel obstruction.  EXAM: ABDOMEN - 2 VIEW  COMPARISON:  11/26/2014  FINDINGS: Persistent distended small bowel loops in left abdomen with air-fluid levels consistent with small bowel obstruction. NG tube in place with tip in mid stomach. Postcholecystectomy surgical clips. Contrast material from yesterday CT  scan noted within urinary bladder.  IMPRESSION: Persistent distended small bowel loops in left abdomen with air-fluid levels consistent with small bowel obstruction. NG tube in place.   Electronically Signed   By: Lahoma Crocker M.D.   On: 11/27/2014 08:52   Dg Abd Portable 1v-small Bowel Obstruction Protocol-initial, 8 Hr Delay  11/27/2014   CLINICAL DATA:  Small-bowel obstruction protocol. 8 hour follow-up image.  EXAM: PORTABLE ABDOMEN - 1 VIEW  COMPARISON:  Abdominal radiograph performed earlier today at 11:22 a.m.  FINDINGS: Contrast is seen filling the colon. There is no evidence of small bowel obstruction, though there is still distention of small bowel loops at the left mid abdomen to 4.3 cm in maximal diameter. This may reflect some degree of residual small bowel dysmotility. No free intra-abdominal air is seen, though evaluation for free air is limited on supine views.  Clips are noted within the right upper quadrant, reflecting prior cholecystectomy. Mild degenerative change is noted at the lower lumbar spine. Contrast is seen partially filling the bladder.  IMPRESSION: Contrast noted filling the colon. No evidence of small bowel obstruction, though there is still distention of small bowel loops at the left mid abdomen to 4.3 cm in maximal diameter. This may reflect some degree of residual small bowel dysmotility. No free intra-abdominal air seen.   Electronically Signed   By: Garald Balding M.D.   On: 11/27/2014 19:10   Dg Abd Portable 1v-small Bowel Protocol-position Verification  11/27/2014   CLINICAL DATA:  Initial image for nasogastric tube placement. Gastrografin administered at 1044 hr, without following small bowel obstruction protocol.  EXAM: PORTABLE ABDOMEN - 1 VIEW  COMPARISON:  Earlier same day  FINDINGS: Nasogastric tube tip is in the gastric fundus. The gastric fundus is opacified with contrast, unknown amount. Gas pattern is unremarkable on this limited image.  IMPRESSION: Nasogastric  tube tip in the gastric fundus. The fundus is full of contrast. As noted in the technologist notes, protocol was not followed with respect to decompression time.   Electronically Signed   By: Nelson Chimes M.D.   On: 11/27/2014 12:02    Microbiology: No results found for this or any previous visit (from the past 240 hour(s)).   Labs: Basic Metabolic Panel:  Recent Labs Lab 11/26/14 1750 11/27/14 0435 11/28/14 0539 11/29/14 0515 11/30/14 0511  NA 138 140 142 143 142  K 3.8 3.7 3.9 3.7 4.1  CL 103 99* 103 106 106  CO2 23 28 27 25 28   GLUCOSE 169* 142* 136* 129* 118*  BUN 21* 21* 25* 23* 19  CREATININE 1.24 1.16 1.14 1.00 1.12  CALCIUM 9.8 9.2 8.8* 8.7* 8.8*   Liver Function Tests:  Recent Labs Lab 11/26/14 1750 11/27/14 0435  AST 22 21  ALT 30 30  ALKPHOS 84 76  BILITOT 1.3* 1.6*  PROT 7.4 7.4  ALBUMIN 4.9 4.7  Recent Labs Lab 11/26/14 1750  LIPASE 23   No results for input(s): AMMONIA in the last 168 hours. CBC:  Recent Labs Lab 11/26/14 1750 11/27/14 0435 11/28/14 0539 11/29/14 0515 11/30/14 0511  WBC 13.0* 11.0* 8.8 8.3 6.5  NEUTROABS 10.8*  --   --   --   --   HGB 18.0* 16.8 16.3 15.4 14.1  HCT 52.1* 50.3 49.1 47.0 43.4  MCV 90.6 93.5 94.2 94.0 94.1  PLT 153 144* 133* 134* 132*   Cardiac Enzymes: No results for input(s): CKTOTAL, CKMB, CKMBINDEX, TROPONINI in the last 168 hours. BNP: BNP (last 3 results) No results for input(s): BNP in the last 8760 hours.  ProBNP (last 3 results) No results for input(s): PROBNP in the last 8760 hours.  CBG:  Recent Labs Lab 11/29/14 0829 11/29/14 1246 11/29/14 1649 11/29/14 2136 11/30/14 0813  GLUCAP 123* 118* 140* 89 121*       Signed:  FELIZ ORTIZ, ABRAHAM  [facility] 11/30/2014, 8:16 AM

## 2015-09-01 DIAGNOSIS — J019 Acute sinusitis, unspecified: Secondary | ICD-10-CM | POA: Diagnosis not present

## 2015-09-10 DIAGNOSIS — J209 Acute bronchitis, unspecified: Secondary | ICD-10-CM | POA: Diagnosis not present

## 2015-09-10 DIAGNOSIS — G4700 Insomnia, unspecified: Secondary | ICD-10-CM | POA: Diagnosis not present

## 2015-09-11 ENCOUNTER — Emergency Department (HOSPITAL_BASED_OUTPATIENT_CLINIC_OR_DEPARTMENT_OTHER)
Admission: EM | Admit: 2015-09-11 | Discharge: 2015-09-11 | Disposition: A | Discharge status: Home / Self Care | Payer: PPO | Attending: Emergency Medicine | Admitting: Emergency Medicine

## 2015-09-11 ENCOUNTER — Encounter (HOSPITAL_BASED_OUTPATIENT_CLINIC_OR_DEPARTMENT_OTHER): Payer: Self-pay | Admitting: Emergency Medicine

## 2015-09-11 DIAGNOSIS — Z8709 Personal history of other diseases of the respiratory system: Secondary | ICD-10-CM | POA: Diagnosis not present

## 2015-09-11 DIAGNOSIS — Z79899 Other long term (current) drug therapy: Secondary | ICD-10-CM | POA: Diagnosis not present

## 2015-09-11 DIAGNOSIS — Z8719 Personal history of other diseases of the digestive system: Secondary | ICD-10-CM | POA: Insufficient documentation

## 2015-09-11 DIAGNOSIS — E119 Type 2 diabetes mellitus without complications: Secondary | ICD-10-CM | POA: Insufficient documentation

## 2015-09-11 DIAGNOSIS — Z7984 Long term (current) use of oral hypoglycemic drugs: Secondary | ICD-10-CM | POA: Insufficient documentation

## 2015-09-11 DIAGNOSIS — Z792 Long term (current) use of antibiotics: Secondary | ICD-10-CM | POA: Insufficient documentation

## 2015-09-11 DIAGNOSIS — T380X5A Adverse effect of glucocorticoids and synthetic analogues, initial encounter: Secondary | ICD-10-CM | POA: Diagnosis not present

## 2015-09-11 DIAGNOSIS — Z72820 Sleep deprivation: Secondary | ICD-10-CM | POA: Diagnosis not present

## 2015-09-11 DIAGNOSIS — G478 Other sleep disorders: Secondary | ICD-10-CM | POA: Diagnosis not present

## 2015-09-11 MED ORDER — DIAZEPAM 10 MG PO TABS: 5.0000 mg | ORAL_TABLET | Freq: Three times a day (TID) | ORAL | Status: DC | PRN

## 2015-09-11 MED ORDER — DIAZEPAM 5 MG PO TABS
5.0000 mg | ORAL_TABLET | Freq: Once | ORAL | Status: AC
  Administered 2015-09-11: 5 mg via ORAL
  Filled 2015-09-11: qty 1

## 2015-09-11 NOTE — ED Provider Notes (Signed)
CSN: [id number]     Arrival date & time 09/11/15  0134 History   First MD Initiated Contact with Patient 09/11/15 0150     Chief Complaint  Patient presents with  . Insomnia     (Consider location/radiation/quality/duration/timing/severity/associated sxs/prior Treatment) HPI  This is a 70 year old male who was seen for flulike symptoms on the 15th of this month. He was placed on Zithromax and prednisone. For the last 6 days he has not been able to sleep at night. He stopped taking the prednisone 5 days ago but his insomnia persists. He was switched from Zithromax to Levaquin earlier this week and was prescribed a cough suppressant containing hydrocodone. He took his first dose of hydrocodone yesterday evening which allowed him to relax but not sleep. He denies hearing voices, suicidal or homicidal ideation. He does feel agitated.  Past Medical History  Diagnosis Date  . Diverticulitis of colon   . Diabetes mellitus without complication [facility])    Past Surgical History  Procedure Laterality Date  . Cholecystectomy    . Abdominal surgery    . Bowel resection    . Cholecystectomy open    . Colostomy takedown  1980s   History reviewed. No pertinent family history. Social History  Substance Use Topics  . Smoking status: Never Smoker   . Smokeless tobacco: None  . Alcohol Use: No    Review of Systems  All other systems reviewed and are negative.   Allergies  Review of patient's allergies indicates no known allergies.  Home Medications   Prior to Admission medications   Medication Sig Start Date End Date Taking? Authorizing Provider  HYDROcodone-homatropine (HYCODAN) 5-1.5 MG/5ML syrup Take 5 mLs by mouth every 6 (six) hours as needed for cough.   Yes Historical Provider, MD  levofloxacin (LEVAQUIN) 750 MG tablet Take 750 mg by mouth daily.   Yes Historical Provider, MD  losartan (COZAAR) 25 MG tablet Take 25 mg by mouth daily.   Yes Historical Provider, MD  pravastatin  (PRAVACHOL) 10 MG tablet Take 10 mg by mouth daily.   Yes Historical Provider, MD  metFORMIN (GLUCOPHAGE) 500 MG tablet Take 1 tablet (500 mg total) by mouth 2 (two) times daily with a meal. 11/30/14   Charlynne Cousins, MD   BP 165/98 mmHg  Pulse 64  Temp(Src) 97.6 F (36.4 C) (Oral)  Resp 18  Ht 5\' 10"  (1.778 m)  Wt 217 lb (98.431 kg)  BMI 31.14 kg/m2  SpO2 96%   Physical Exam  General: Well-developed, well-nourished male in no acute distress; appearance consistent with age of record HENT: normocephalic; atraumatic Eyes: pupils equal, round and reactive to light; extraocular muscles intact Neck: supple Heart: regular rate and rhythm Lungs: clear to auscultation bilaterally Abdomen: soft; nondistended; nontender; no masses or hepatosplenomegaly; bowel sounds present Extremities: No deformity; full range of motion; pulses normal Neurologic: Awake, alert and oriented; motor function intact in all extremities and symmetric; no facial droop Skin: Warm and dry Psychiatric: Normal mood and affect    ED Course  Procedures (including critical care time)   MDM  Suspect steroid reaction. He was advised to discontinue the prednisone. We will place him on a short course of Valium. He was discouraged from taking Valium and hydrocodone together.\    Shanon Rosser, MD 09/11/15 [phone]

## 2015-09-11 NOTE — ED Notes (Signed)
Patient has been recently treated for the flu with antibiotics and steriods. The patient reports that he has not had sleep since last Saturday. The patient was placed on a cough syrup today with vicodin in it and it helped him "rest" for a little while but he was unable to sleep at all. The patient reports that he is anxious about this at this time,

## 2015-09-11 NOTE — Discharge Instructions (Signed)
Drug Toxicity °Drug toxicity refers to harmful and unwanted (adverse) effects of a drug in your body. Drug toxicity often results from taking too much of a drug (overdose) by accident or on purpose. With some drugs, there is only a small difference between the dose that is needed to treat your condition and a dose that is harmful (narrow therapeutic range). However, any drug can be toxic at high doses, and even normal doses of certain drugs can be toxic for some people. These include over-the-counter (OTC) medicines. °Drug toxicity can happen suddenly when you first start taking a drug or when you suddenly take too much of a drug (acute toxicity). It can also happen as a result of taking a drug for a long period of time (chronic toxicity). The effects of drug toxicity can be mild, dangerous, or even deadly. °CAUSES °Many things can cause drug toxicity. Common causes of acute toxicity include a drug overdose or an allergic reaction to a drug. °Most drugs are broken down (metabolized) by your liver and eliminated (excreted) by your kidneys. Chronic drug toxicity can result from changes in the way that your body metabolizes a drug. This can happen, for example, if you weigh less than you did when you started taking a drug but you keep taking the same dose that you took at the heavier weight. °RISK FACTORS °You may have a higher risk for drug toxicity if you: °· Are under 18 years of age or over 65 years of age. °· Have liver disease, kidney disease, or another medical condition. °· Are taking more than one drug. °· Are pregnant. °· Are allergic to certain drugs. °· Have genes that cause you to be more affected by (susceptible to) certain drugs. °· Take a drug that has a narrow therapeutic range. °Certain types of drugs are more likely than others to cause toxicity. Many drugs have a narrow therapeutic range, including: °· Blood thinners. °· Heart medicines. °· Diabetes medicines. °· Medicines to prevent or stop  seizures. °· Theophylline for asthma. °· Lithium for bipolar disorder. °SYMPTOMS °Signs and symptoms of drug toxicity depend on the drug and the amount that was taken. They may start suddenly or develop gradually over time. °DIAGNOSIS °Drug toxicity may be diagnosed based on your symptoms. Some drugs have known side effects that suggest toxicity. It is important that you tell health care provider about all of the drugs that you are taking and whether you have ever had a reaction to a drug. °Your health care provider will do a physical exam. You may have tests to check for drug toxicity, including: °· Blood tests to measure the amount of the drug in your blood or to check for signs of kidney or liver damage. °· Urine tests. °· Other tests to check for organ damage. °TREATMENT °Treatment may include: °· Stopping the drug. °· Lowering the dose of the drug. °· Switching to a different drug. °You may also need treatment to stop or reverse the effects of the toxicity. These treatments depend on the drug that caused the toxicity, how severe the toxicity is, and which parts of your body are affected. °HOME CARE INSTRUCTIONS °· Take medicines only as directed by your health care provider. Always ask your health care provider to discuss the possible side effects of any new drug that you start taking. °· Keep a list of all of the drugs that you take, including over-the-counter medicines. Bring this list with you to all of your medical visits. °· Read   the drug inserts that come with your medicines. °· Keep all follow-up visits as directed by your health care provider. This is important. °SEEK MEDICAL CARE IF: °· Your symptoms return. °· You develop any new signs or symptoms when you are taking medicines. °· You notice any signs that indicate that you are taking too much of your medicine, based on what your health care provider told you to watch for. °SEEK IMMEDIATE MEDICAL CARE IF: °· You have chest pain. °· You have difficulty  breathing. °· You have a loss of consciousness. °  °This information is not intended to replace advice given to you by your health care provider. Make sure you discuss any questions you have with your health care provider. °  °Document Released: 07/03/2005 Document Revised: 11/17/2014 Document Reviewed: 07/08/2014 °Elsevier Interactive Patient Education ©2016 [employer] ° °

## 2015-09-20 DIAGNOSIS — Z7984 Long term (current) use of oral hypoglycemic drugs: Secondary | ICD-10-CM | POA: Diagnosis not present

## 2015-09-20 DIAGNOSIS — E291 Testicular hypofunction: Secondary | ICD-10-CM | POA: Diagnosis not present

## 2015-09-20 DIAGNOSIS — E782 Mixed hyperlipidemia: Secondary | ICD-10-CM | POA: Diagnosis not present

## 2015-09-20 DIAGNOSIS — I77811 Abdominal aortic ectasia: Secondary | ICD-10-CM | POA: Diagnosis not present

## 2015-09-20 DIAGNOSIS — I10 Essential (primary) hypertension: Secondary | ICD-10-CM | POA: Diagnosis not present

## 2015-09-20 DIAGNOSIS — E119 Type 2 diabetes mellitus without complications: Secondary | ICD-10-CM | POA: Diagnosis not present

## 2015-09-20 DIAGNOSIS — Z8719 Personal history of other diseases of the digestive system: Secondary | ICD-10-CM | POA: Diagnosis not present

## 2015-12-22 DIAGNOSIS — L820 Inflamed seborrheic keratosis: Secondary | ICD-10-CM | POA: Diagnosis not present

## 2016-03-31 DIAGNOSIS — E119 Type 2 diabetes mellitus without complications: Secondary | ICD-10-CM | POA: Diagnosis not present

## 2016-03-31 DIAGNOSIS — E782 Mixed hyperlipidemia: Secondary | ICD-10-CM | POA: Diagnosis not present

## 2016-03-31 DIAGNOSIS — Z87898 Personal history of other specified conditions: Secondary | ICD-10-CM | POA: Diagnosis not present

## 2016-03-31 DIAGNOSIS — Z8719 Personal history of other diseases of the digestive system: Secondary | ICD-10-CM | POA: Diagnosis not present

## 2016-03-31 DIAGNOSIS — I10 Essential (primary) hypertension: Secondary | ICD-10-CM | POA: Diagnosis not present

## 2016-03-31 DIAGNOSIS — Z1211 Encounter for screening for malignant neoplasm of colon: Secondary | ICD-10-CM | POA: Diagnosis not present

## 2016-03-31 DIAGNOSIS — Z1159 Encounter for screening for other viral diseases: Secondary | ICD-10-CM | POA: Diagnosis not present

## 2016-03-31 DIAGNOSIS — Z1389 Encounter for screening for other disorder: Secondary | ICD-10-CM | POA: Diagnosis not present

## 2016-03-31 DIAGNOSIS — Z0000 Encounter for general adult medical examination without abnormal findings: Secondary | ICD-10-CM | POA: Diagnosis not present

## 2016-03-31 DIAGNOSIS — E291 Testicular hypofunction: Secondary | ICD-10-CM | POA: Diagnosis not present

## 2016-03-31 DIAGNOSIS — I77811 Abdominal aortic ectasia: Secondary | ICD-10-CM | POA: Diagnosis not present

## 2016-03-31 DIAGNOSIS — Z23 Encounter for immunization: Secondary | ICD-10-CM | POA: Diagnosis not present

## 2016-04-12 DIAGNOSIS — R972 Elevated prostate specific antigen [PSA]: Secondary | ICD-10-CM | POA: Diagnosis not present

## 2016-04-21 DIAGNOSIS — Z1211 Encounter for screening for malignant neoplasm of colon: Secondary | ICD-10-CM | POA: Diagnosis not present

## 2016-05-03 DIAGNOSIS — H903 Sensorineural hearing loss, bilateral: Secondary | ICD-10-CM | POA: Diagnosis not present

## 2016-05-11 DIAGNOSIS — R972 Elevated prostate specific antigen [PSA]: Secondary | ICD-10-CM | POA: Diagnosis not present

## 2016-05-18 DIAGNOSIS — R972 Elevated prostate specific antigen [PSA]: Secondary | ICD-10-CM | POA: Diagnosis not present

## 2016-07-19 ENCOUNTER — Encounter (HOSPITAL_BASED_OUTPATIENT_CLINIC_OR_DEPARTMENT_OTHER): Payer: Self-pay

## 2016-07-19 ENCOUNTER — Emergency Department (HOSPITAL_BASED_OUTPATIENT_CLINIC_OR_DEPARTMENT_OTHER)
Admission: EM | Admit: 2016-07-19 | Discharge: 2016-07-19 | Disposition: A | Discharge status: Home / Self Care | Payer: PPO | Attending: Emergency Medicine | Admitting: Emergency Medicine

## 2016-07-19 DIAGNOSIS — Z7984 Long term (current) use of oral hypoglycemic drugs: Secondary | ICD-10-CM | POA: Insufficient documentation

## 2016-07-19 DIAGNOSIS — E119 Type 2 diabetes mellitus without complications: Secondary | ICD-10-CM | POA: Diagnosis not present

## 2016-07-19 DIAGNOSIS — J040 Acute laryngitis: Secondary | ICD-10-CM

## 2016-07-19 DIAGNOSIS — J029 Acute pharyngitis, unspecified: Secondary | ICD-10-CM | POA: Diagnosis not present

## 2016-07-19 HISTORY — DX: Unspecified intestinal obstruction, unspecified as to partial versus complete obstruction: K56.609

## 2016-07-19 LAB — RAPID STREP SCREEN (MED CTR MEBANE ONLY): Streptococcus, Group A Screen (Direct): NEGATIVE

## 2016-07-19 NOTE — ED Notes (Signed)
Pt made aware to return if symptoms worsen or if any life threatening symptoms occur.   

## 2016-07-19 NOTE — Discharge Instructions (Signed)
Ibuprofen 600 mg every 6 hours as needed for pain or fever.  Drink plenty of fluids and get plenty of rest.  Follow up with your primary Dr. if symptoms do not subside in the next week, and return to the ER if symptoms significantly worsen or change.

## 2016-07-19 NOTE — ED Notes (Signed)
ED Provider at bedside. 

## 2016-07-19 NOTE — ED Provider Notes (Signed)
[facility] Provider Note   CSN: [id number] Arrival date & time: 07/19/16  1952  By signing my name below, I, Dora Sims, attest that this documentation has been prepared under the direction and in the presence of physician practitioner, Veryl Speak, MD. Electronically Signed: Dora Sims, Scribe. 07/19/2016. 8:42 PM.  History   Chief Complaint Chief Complaint  Patient presents with  . Sore Throat    The history is provided by the patient. No language interpreter was used.     HPI Comments: Tanner [name] is a 71 y.o. male who presents to the Emergency Department complaining of an intermittent hoarse voice since this morning. He states his hoarseness has been constant for the last few hours. He states he had a sore throat, sinus pressure, postnasal drip, congestion, and rhinorrhea several days ago which are resolved. He notes his granddaughter has cold-like symptoms and he is around her regularly. He takes metformin, losartan, and pravastatin on a regular basis. He denies fever, chills, trouble swallowing, SOB, ear pain, neck pain, cervical adenopathy, or any other associated symptoms.  Past Medical History:  Diagnosis Date  . Diabetes mellitus without complication ([place])   . Diverticulitis of colon   . Small bowel obstruction     Patient Active Problem List   Diagnosis Date Noted  . Controlled type 2 diabetes mellitus without complication ([place]) [id number]  . SBO (small bowel obstruction) 11/26/2014  . Diverticulosis of colon without hemorrhage 11/26/2014  . Hyperglycemia 11/26/2014  . DDD (degenerative disc disease), cervical 10/23/2014    Past Surgical History:  Procedure Laterality Date  . ABDOMINAL SURGERY    . BOWEL RESECTION    . CHOLECYSTECTOMY    . CHOLECYSTECTOMY OPEN    . COLOSTOMY TAKEDOWN  1980s       Home Medications    Prior to Admission medications   Medication Sig Start Date End Date Taking? Authorizing Provider  diazepam  (VALIUM) 10 MG tablet Take 0.5-1 tablets (5-10 mg total) by mouth every 8 (eight) hours as needed for anxiety or sleep. 09/11/15   John Molpus, MD  HYDROcodone-homatropine (HYCODAN) 5-1.5 MG/5ML syrup Take 5 mLs by mouth every 6 (six) hours as needed for cough.    Historical Provider, MD  levofloxacin (LEVAQUIN) 750 MG tablet Take 750 mg by mouth daily.    Historical Provider, MD  losartan (COZAAR) 25 MG tablet Take 25 mg by mouth daily.    Historical Provider, MD  metFORMIN (GLUCOPHAGE) 500 MG tablet Take 1 tablet (500 mg total) by mouth 2 (two) times daily with a meal. 11/30/14   Charlynne Cousins, MD  pravastatin (PRAVACHOL) 10 MG tablet Take 10 mg by mouth daily.    Historical Provider, MD    Family History No family history on file.  Social History Social History  Substance Use Topics  . Smoking status: Never Smoker  . Smokeless tobacco: Not on file  . Alcohol use No     Allergies   Patient has no known allergies.   Review of Systems Review of Systems  Constitutional: Negative for chills and fever.  HENT: Positive for congestion (resolved), postnasal drip (resolved), rhinorrhea (resolved), sinus pressure (resolved), sore throat (resolved) and voice change (hoarse). Negative for trouble swallowing.   Respiratory: Negative for shortness of breath.   All other systems reviewed and are negative.    Physical Exam Updated Vital Signs BP 147/78 (BP Location: Left Arm)   Pulse 88   Temp 98.2 F (36.8 C) (Oral)  Resp 18   Ht 5\' 11"  (1.803 m)   Wt 220 lb (99.8 kg)   SpO2 100%   BMI 30.68 kg/m   Physical Exam  Constitutional: He is oriented to person, place, and time. He appears well-developed and well-nourished.  HENT:  Head: Normocephalic and atraumatic.  Hoarse voice.  Eyes: EOM are normal.  Neck: Normal range of motion.  Cardiovascular: Normal rate, regular rhythm, normal heart sounds and intact distal pulses.   Pulmonary/Chest: Effort normal and breath sounds  normal. No respiratory distress.  Abdominal: Soft. He exhibits no distension. There is no tenderness.  Musculoskeletal: Normal range of motion.  Neurological: He is alert and oriented to person, place, and time.  Skin: Skin is warm and dry.  Psychiatric: He has a normal mood and affect. Judgment normal.  Nursing note and vitals reviewed.    ED Treatments / Results  Labs (all labs ordered are listed, but only abnormal results are displayed) Labs Reviewed  RAPID STREP SCREEN (NOT AT [facility])  CULTURE, GROUP A STREP [facility])    EKG  EKG Interpretation None       Radiology No results found.  Procedures Procedures (including critical care time)  DIAGNOSTIC STUDIES: Oxygen Saturation is 100% on RA, normal by my interpretation.    COORDINATION OF CARE: 8:46 PM Discussed treatment plan with pt at bedside and pt agreed to plan.  Medications Ordered in ED Medications - No data to display   Initial Impression / Assessment and Plan / ED Course  I have reviewed the triage vital signs and the nursing notes.  Pertinent labs & imaging results that were available during my care of the patient were reviewed by me and considered in my medical decision making (see chart for details).  Clinical Course     Patient presents with course voice 3 days into an upper respiratory infection. He appears to have laryngitis with no complicating features. He will be treated with ibuprofen, plenty of fluids, rest, and when necessary return. His strep test is negative and I highly suspect a viral etiology.  Final Clinical Impressions(s) / ED Diagnoses   Final diagnoses:  None    New Prescriptions New Prescriptions   No medications on file   I personally performed the services described in this documentation, which was scribed in my presence. The recorded information has been reviewed and is accurate.       Veryl Speak, MD 07/19/16 2157

## 2016-07-19 NOTE — ED Triage Notes (Signed)
Pt woke with a sore throat Saturday, that went away and then this morning it came back and pt lost his voice.  No fevers at home, pt speaking in full sentences, no SOB or acute distress noted.

## 2016-07-19 NOTE — ED Notes (Signed)
Pt reports head congestion, sore throat, loss of voice (voice is raspy).  Pt has no exudate on tonsils, throat does not appear red at this time.  Pt has had nasal drainage and congestion.

## 2016-07-22 LAB — CULTURE, GROUP A STREP (THRC)

## 2016-09-29 DIAGNOSIS — I77811 Abdominal aortic ectasia: Secondary | ICD-10-CM | POA: Diagnosis not present

## 2016-09-29 DIAGNOSIS — Z8719 Personal history of other diseases of the digestive system: Secondary | ICD-10-CM | POA: Diagnosis not present

## 2016-09-29 DIAGNOSIS — Z7984 Long term (current) use of oral hypoglycemic drugs: Secondary | ICD-10-CM | POA: Diagnosis not present

## 2016-09-29 DIAGNOSIS — E119 Type 2 diabetes mellitus without complications: Secondary | ICD-10-CM | POA: Diagnosis not present

## 2016-09-29 DIAGNOSIS — E291 Testicular hypofunction: Secondary | ICD-10-CM | POA: Diagnosis not present

## 2016-09-29 DIAGNOSIS — I10 Essential (primary) hypertension: Secondary | ICD-10-CM | POA: Diagnosis not present

## 2016-09-29 DIAGNOSIS — Z87898 Personal history of other specified conditions: Secondary | ICD-10-CM | POA: Diagnosis not present

## 2016-09-29 DIAGNOSIS — E782 Mixed hyperlipidemia: Secondary | ICD-10-CM | POA: Diagnosis not present

## 2017-02-26 DIAGNOSIS — L820 Inflamed seborrheic keratosis: Secondary | ICD-10-CM | POA: Diagnosis not present

## 2017-02-26 DIAGNOSIS — L814 Other melanin hyperpigmentation: Secondary | ICD-10-CM | POA: Diagnosis not present

## 2017-02-26 DIAGNOSIS — D1801 Hemangioma of skin and subcutaneous tissue: Secondary | ICD-10-CM | POA: Diagnosis not present

## 2017-02-26 DIAGNOSIS — L821 Other seborrheic keratosis: Secondary | ICD-10-CM | POA: Diagnosis not present

## 2017-02-26 DIAGNOSIS — D229 Melanocytic nevi, unspecified: Secondary | ICD-10-CM | POA: Diagnosis not present

## 2017-02-26 DIAGNOSIS — L570 Actinic keratosis: Secondary | ICD-10-CM | POA: Diagnosis not present

## 2017-04-02 DIAGNOSIS — E782 Mixed hyperlipidemia: Secondary | ICD-10-CM | POA: Diagnosis not present

## 2017-04-02 DIAGNOSIS — Z1389 Encounter for screening for other disorder: Secondary | ICD-10-CM | POA: Diagnosis not present

## 2017-04-02 DIAGNOSIS — Z0000 Encounter for general adult medical examination without abnormal findings: Secondary | ICD-10-CM | POA: Diagnosis not present

## 2017-04-02 DIAGNOSIS — Z1211 Encounter for screening for malignant neoplasm of colon: Secondary | ICD-10-CM | POA: Diagnosis not present

## 2017-04-02 DIAGNOSIS — Z23 Encounter for immunization: Secondary | ICD-10-CM | POA: Diagnosis not present

## 2017-04-02 DIAGNOSIS — I10 Essential (primary) hypertension: Secondary | ICD-10-CM | POA: Diagnosis not present

## 2017-04-02 DIAGNOSIS — Z87898 Personal history of other specified conditions: Secondary | ICD-10-CM | POA: Diagnosis not present

## 2017-04-02 DIAGNOSIS — Z8719 Personal history of other diseases of the digestive system: Secondary | ICD-10-CM | POA: Diagnosis not present

## 2017-04-02 DIAGNOSIS — E119 Type 2 diabetes mellitus without complications: Secondary | ICD-10-CM | POA: Diagnosis not present

## 2017-04-02 DIAGNOSIS — Z7984 Long term (current) use of oral hypoglycemic drugs: Secondary | ICD-10-CM | POA: Diagnosis not present

## 2017-04-02 DIAGNOSIS — E291 Testicular hypofunction: Secondary | ICD-10-CM | POA: Diagnosis not present

## 2017-04-02 DIAGNOSIS — I77811 Abdominal aortic ectasia: Secondary | ICD-10-CM | POA: Diagnosis not present

## 2017-04-10 DIAGNOSIS — Z1211 Encounter for screening for malignant neoplasm of colon: Secondary | ICD-10-CM | POA: Diagnosis not present

## 2017-04-16 DIAGNOSIS — N5201 Erectile dysfunction due to arterial insufficiency: Secondary | ICD-10-CM | POA: Diagnosis not present

## 2017-04-16 DIAGNOSIS — N400 Enlarged prostate without lower urinary tract symptoms: Secondary | ICD-10-CM | POA: Diagnosis not present

## 2017-04-22 ENCOUNTER — Emergency Department (HOSPITAL_BASED_OUTPATIENT_CLINIC_OR_DEPARTMENT_OTHER)
Admission: EM | Admit: 2017-04-22 | Discharge: 2017-04-22 | Disposition: A | Discharge status: Home / Self Care | Payer: PPO | Attending: Emergency Medicine | Admitting: Emergency Medicine

## 2017-04-22 ENCOUNTER — Encounter (HOSPITAL_BASED_OUTPATIENT_CLINIC_OR_DEPARTMENT_OTHER): Payer: Self-pay

## 2017-04-22 DIAGNOSIS — Z79899 Other long term (current) drug therapy: Secondary | ICD-10-CM | POA: Diagnosis not present

## 2017-04-22 DIAGNOSIS — N4839 Other priapism: Secondary | ICD-10-CM | POA: Insufficient documentation

## 2017-04-22 DIAGNOSIS — E119 Type 2 diabetes mellitus without complications: Secondary | ICD-10-CM | POA: Insufficient documentation

## 2017-04-22 DIAGNOSIS — N4830 Priapism, unspecified: Secondary | ICD-10-CM

## 2017-04-22 DIAGNOSIS — I10 Essential (primary) hypertension: Secondary | ICD-10-CM | POA: Diagnosis not present

## 2017-04-22 DIAGNOSIS — Z7984 Long term (current) use of oral hypoglycemic drugs: Secondary | ICD-10-CM | POA: Insufficient documentation

## 2017-04-22 HISTORY — DX: Male erectile dysfunction, unspecified: N52.9

## 2017-04-22 HISTORY — DX: Essential (primary) hypertension: I10

## 2017-04-22 MED ORDER — PHENYLEPHRINE 200 MCG/ML FOR PRIAPISM / HYPOTENSION
50.0000 ug | Freq: Once | INTRAMUSCULAR | Status: DC
  Filled 2017-04-22 (×2): qty 50

## 2017-04-22 NOTE — ED Notes (Signed)
Penile erection is resolved.   MD made aware.

## 2017-04-22 NOTE — ED Triage Notes (Addendum)
Pt reports priapism for 4 hours, after taking Trimix. Benadryl taken PTA.

## 2017-04-22 NOTE — ED Provider Notes (Signed)
[facility] Provider Note   CSN: [id number] Arrival date & time: 04/22/17  1912     History   Chief Complaint Chief Complaint  Patient presents with  . Priapism    HPI Tanner [name] is a 71 y.o. male.  Patient c/o persistent erection for past 4-5 hours. States used trimix, had intercourse, and erection started to go down, but now persists. No hx same, but states never used product before. Denies severe penile pain. No abd pain. No nv. No fevers. States otherwise feels well.    The history is provided by the patient.    Past Medical History:  Diagnosis Date  . Diabetes mellitus without complication ([place])   . Diverticulitis of colon   . Erectile dysfunction   . Hypertension   . Small bowel obstruction [facility])     Patient Active Problem List   Diagnosis Date Noted  . Controlled type 2 diabetes mellitus without complication ([place]) 81/27/5170  . SBO (small bowel obstruction) ([place]) 11/26/2014  . Diverticulosis of colon without hemorrhage 11/26/2014  . Hyperglycemia 11/26/2014  . DDD (degenerative disc disease), cervical 10/23/2014    Past Surgical History:  Procedure Laterality Date  . ABDOMINAL SURGERY    . BOWEL RESECTION    . CHOLECYSTECTOMY    . CHOLECYSTECTOMY OPEN    . COLOSTOMY TAKEDOWN  1980s       Home Medications    Prior to Admission medications   Medication Sig Start Date End Date Taking? Authorizing Provider  metFORMIN (GLUCOPHAGE) 500 MG tablet Take 1 tablet (500 mg total) by mouth 2 (two) times daily with a meal. 11/30/14  Yes Charlynne Cousins, MD  pravastatin (PRAVACHOL) 10 MG tablet Take 10 mg by mouth daily.   Yes [provider]  UNABLE TO FIND Med Name: Trimix   Yes [provider]  diazepam (VALIUM) 10 MG tablet Take 0.5-1 tablets (5-10 mg total) by mouth every 8 (eight) hours as needed for anxiety or sleep. 09/11/15   Molpus, John, MD  HYDROcodone-homatropine [facility]) 5-1.5 MG/5ML syrup Take 5 mLs by  mouth every 6 (six) hours as needed for cough.    [provider]  levofloxacin (LEVAQUIN) 750 MG tablet Take 750 mg by mouth daily.    [provider]  losartan (COZAAR) 25 MG tablet Take 25 mg by mouth daily.    [provider]    Family History History reviewed. No pertinent family history.  Social History Social History  Substance Use Topics  . Smoking status: Never Smoker  . Smokeless tobacco: Never Used  . Alcohol use No     Allergies   Prednisone   Review of Systems Review of Systems  Constitutional: Negative for fever.  Gastrointestinal: Negative for abdominal pain.  Genitourinary: Positive for penile swelling. Negative for dysuria, scrotal swelling and testicular pain.     Physical Exam Updated Vital Signs BP (!) 169/95 (BP Location: Left Arm)   Pulse 81   Temp 98.3 F (36.8 C) (Oral)   Resp 16   Ht 1.803 m (5\' 11" )   Wt 102.1 kg (225 lb)   SpO2 98%   BMI 31.38 kg/m   Physical Exam  Constitutional: He appears well-developed and well-nourished. No distress.  HENT:  Head: Atraumatic.  Eyes: Conjunctivae are normal.  Neck: Neck supple. No tracheal deviation present.  Pulmonary/Chest: Effort normal. No accessory muscle usage. No respiratory distress.  Abdominal: Soft. He exhibits no distension. There is no tenderness.  Genitourinary:  Genitourinary Comments:  Erect penis, otherwise normal gu exam, normal color.   Musculoskeletal: He exhibits no edema.  Neurological: He is alert.  Skin: Skin is warm and dry. He is not diaphoretic.  Psychiatric: He has a normal mood and affect.  Nursing note and vitals reviewed.    ED Treatments / Results  Labs (all labs ordered are listed, but only abnormal results are displayed) Labs Reviewed - No data to display  EKG  EKG Interpretation None       Radiology No results found.  Procedures Procedures (including critical care time)  Medications Ordered in ED Medications    phenylephrine 200 mcg / ml CONC. DILUTION INJ (ED / Urology USE ONLY) (not administered)     Initial Impression / Assessment and Plan / ED Course  I have reviewed the triage vital signs and the nursing notes.  Pertinent labs & imaging results that were available during my care of the patient were reviewed by me and considered in my medical decision making (see chart for details).  icepack to sore.   Erection persists.   Reviewed nursing notes and prior charts for additional history.   Priapism resolved.   Pt appears stable for d/c.      Final Clinical Impressions(s) / ED Diagnoses   Final diagnoses:  None    New Prescriptions New Prescriptions   No medications on file     Lajean Saver, MD 04/22/17 2138

## 2017-04-22 NOTE — Discharge Instructions (Signed)
It was our pleasure to provide your ER care today - we hope that you feel better.  Discuss with your urologist whether to adjust/decrease the dose of your medication.  Return to ER if worse, symptoms recur, other concern.

## 2017-05-09 DIAGNOSIS — N5201 Erectile dysfunction due to arterial insufficiency: Secondary | ICD-10-CM | POA: Diagnosis not present

## 2017-08-09 DIAGNOSIS — N5201 Erectile dysfunction due to arterial insufficiency: Secondary | ICD-10-CM | POA: Diagnosis not present

## 2017-10-19 DIAGNOSIS — Z8719 Personal history of other diseases of the digestive system: Secondary | ICD-10-CM | POA: Diagnosis not present

## 2017-10-19 DIAGNOSIS — E782 Mixed hyperlipidemia: Secondary | ICD-10-CM | POA: Diagnosis not present

## 2017-10-19 DIAGNOSIS — E291 Testicular hypofunction: Secondary | ICD-10-CM | POA: Diagnosis not present

## 2017-10-19 DIAGNOSIS — B351 Tinea unguium: Secondary | ICD-10-CM | POA: Diagnosis not present

## 2017-10-19 DIAGNOSIS — I10 Essential (primary) hypertension: Secondary | ICD-10-CM | POA: Diagnosis not present

## 2017-10-19 DIAGNOSIS — E1169 Type 2 diabetes mellitus with other specified complication: Secondary | ICD-10-CM | POA: Diagnosis not present

## 2017-10-19 DIAGNOSIS — Z7984 Long term (current) use of oral hypoglycemic drugs: Secondary | ICD-10-CM | POA: Diagnosis not present

## 2017-10-19 DIAGNOSIS — Z87898 Personal history of other specified conditions: Secondary | ICD-10-CM | POA: Diagnosis not present

## 2017-10-19 DIAGNOSIS — I77811 Abdominal aortic ectasia: Secondary | ICD-10-CM | POA: Diagnosis not present

## 2017-11-13 DIAGNOSIS — H25811 Combined forms of age-related cataract, right eye: Secondary | ICD-10-CM | POA: Diagnosis not present

## 2017-11-13 DIAGNOSIS — H25011 Cortical age-related cataract, right eye: Secondary | ICD-10-CM | POA: Diagnosis not present

## 2017-11-13 DIAGNOSIS — H2511 Age-related nuclear cataract, right eye: Secondary | ICD-10-CM | POA: Diagnosis not present

## 2018-03-05 DIAGNOSIS — H903 Sensorineural hearing loss, bilateral: Secondary | ICD-10-CM | POA: Diagnosis not present

## 2018-03-21 DIAGNOSIS — L570 Actinic keratosis: Secondary | ICD-10-CM | POA: Diagnosis not present

## 2018-03-21 DIAGNOSIS — L905 Scar conditions and fibrosis of skin: Secondary | ICD-10-CM | POA: Diagnosis not present

## 2018-03-21 DIAGNOSIS — D1801 Hemangioma of skin and subcutaneous tissue: Secondary | ICD-10-CM | POA: Diagnosis not present

## 2018-03-21 DIAGNOSIS — L814 Other melanin hyperpigmentation: Secondary | ICD-10-CM | POA: Diagnosis not present

## 2018-03-21 DIAGNOSIS — D225 Melanocytic nevi of trunk: Secondary | ICD-10-CM | POA: Diagnosis not present

## 2018-05-15 DIAGNOSIS — E782 Mixed hyperlipidemia: Secondary | ICD-10-CM | POA: Diagnosis not present

## 2018-05-15 DIAGNOSIS — Z8719 Personal history of other diseases of the digestive system: Secondary | ICD-10-CM | POA: Diagnosis not present

## 2018-05-15 DIAGNOSIS — Z1389 Encounter for screening for other disorder: Secondary | ICD-10-CM | POA: Diagnosis not present

## 2018-05-15 DIAGNOSIS — N529 Male erectile dysfunction, unspecified: Secondary | ICD-10-CM | POA: Diagnosis not present

## 2018-05-15 DIAGNOSIS — E291 Testicular hypofunction: Secondary | ICD-10-CM | POA: Diagnosis not present

## 2018-05-15 DIAGNOSIS — Z125 Encounter for screening for malignant neoplasm of prostate: Secondary | ICD-10-CM | POA: Diagnosis not present

## 2018-05-15 DIAGNOSIS — E1169 Type 2 diabetes mellitus with other specified complication: Secondary | ICD-10-CM | POA: Diagnosis not present

## 2018-05-15 DIAGNOSIS — I10 Essential (primary) hypertension: Secondary | ICD-10-CM | POA: Diagnosis not present

## 2018-05-15 DIAGNOSIS — D696 Thrombocytopenia, unspecified: Secondary | ICD-10-CM | POA: Diagnosis not present

## 2018-05-15 DIAGNOSIS — Z0000 Encounter for general adult medical examination without abnormal findings: Secondary | ICD-10-CM | POA: Diagnosis not present

## 2018-05-15 DIAGNOSIS — Z1211 Encounter for screening for malignant neoplasm of colon: Secondary | ICD-10-CM | POA: Diagnosis not present

## 2018-05-15 DIAGNOSIS — Z23 Encounter for immunization: Secondary | ICD-10-CM | POA: Diagnosis not present

## 2018-05-15 DIAGNOSIS — Z87898 Personal history of other specified conditions: Secondary | ICD-10-CM | POA: Diagnosis not present

## 2018-05-21 DIAGNOSIS — L989 Disorder of the skin and subcutaneous tissue, unspecified: Secondary | ICD-10-CM | POA: Diagnosis not present

## 2018-05-21 DIAGNOSIS — D485 Neoplasm of uncertain behavior of skin: Secondary | ICD-10-CM | POA: Diagnosis not present

## 2018-08-16 DIAGNOSIS — N400 Enlarged prostate without lower urinary tract symptoms: Secondary | ICD-10-CM | POA: Diagnosis not present

## 2018-08-16 DIAGNOSIS — N5201 Erectile dysfunction due to arterial insufficiency: Secondary | ICD-10-CM | POA: Diagnosis not present

## 2018-08-19 DIAGNOSIS — J342 Deviated nasal septum: Secondary | ICD-10-CM | POA: Diagnosis not present

## 2018-08-19 DIAGNOSIS — J300 Vasomotor rhinitis: Secondary | ICD-10-CM | POA: Diagnosis not present

## 2018-12-23 DIAGNOSIS — H25012 Cortical age-related cataract, left eye: Secondary | ICD-10-CM | POA: Diagnosis not present

## 2018-12-23 DIAGNOSIS — H2512 Age-related nuclear cataract, left eye: Secondary | ICD-10-CM | POA: Diagnosis not present

## 2018-12-23 DIAGNOSIS — Z961 Presence of intraocular lens: Secondary | ICD-10-CM | POA: Diagnosis not present

## 2019-01-16 DIAGNOSIS — D696 Thrombocytopenia, unspecified: Secondary | ICD-10-CM | POA: Diagnosis not present

## 2019-01-16 DIAGNOSIS — I10 Essential (primary) hypertension: Secondary | ICD-10-CM | POA: Diagnosis not present

## 2019-01-16 DIAGNOSIS — Z8719 Personal history of other diseases of the digestive system: Secondary | ICD-10-CM | POA: Diagnosis not present

## 2019-01-16 DIAGNOSIS — E782 Mixed hyperlipidemia: Secondary | ICD-10-CM | POA: Diagnosis not present

## 2019-01-16 DIAGNOSIS — E291 Testicular hypofunction: Secondary | ICD-10-CM | POA: Diagnosis not present

## 2019-01-16 DIAGNOSIS — I77811 Abdominal aortic ectasia: Secondary | ICD-10-CM | POA: Diagnosis not present

## 2019-01-16 DIAGNOSIS — Z87898 Personal history of other specified conditions: Secondary | ICD-10-CM | POA: Diagnosis not present

## 2019-01-16 DIAGNOSIS — N529 Male erectile dysfunction, unspecified: Secondary | ICD-10-CM | POA: Diagnosis not present

## 2019-01-16 DIAGNOSIS — E1169 Type 2 diabetes mellitus with other specified complication: Secondary | ICD-10-CM | POA: Diagnosis not present

## 2019-02-06 DIAGNOSIS — Z1159 Encounter for screening for other viral diseases: Secondary | ICD-10-CM | POA: Diagnosis not present

## 2019-02-06 DIAGNOSIS — I10 Essential (primary) hypertension: Secondary | ICD-10-CM | POA: Diagnosis not present

## 2019-03-07 ENCOUNTER — Ambulatory Visit (INDEPENDENT_AMBULATORY_CARE_PROVIDER_SITE_OTHER): Payer: PPO | Admitting: Cardiovascular Disease

## 2019-03-07 ENCOUNTER — Encounter: Payer: Self-pay | Admitting: Cardiovascular Disease

## 2019-03-07 ENCOUNTER — Other Ambulatory Visit: Payer: Self-pay

## 2019-03-07 VITALS — BP 120/50 | HR 77 | Temp 97.0°F | Ht 70.0 in | Wt 222.2 lb

## 2019-03-07 DIAGNOSIS — R0602 Shortness of breath: Secondary | ICD-10-CM

## 2019-03-07 DIAGNOSIS — I10 Essential (primary) hypertension: Secondary | ICD-10-CM

## 2019-03-07 DIAGNOSIS — I493 Ventricular premature depolarization: Secondary | ICD-10-CM | POA: Diagnosis not present

## 2019-03-07 DIAGNOSIS — R9431 Abnormal electrocardiogram [ECG] [EKG]: Secondary | ICD-10-CM

## 2019-03-07 MED ORDER — LOSARTAN POTASSIUM 50 MG PO TABS: 50.0000 mg | ORAL_TABLET | Freq: Every day | ORAL | 3 refills | Status: DC

## 2019-03-07 NOTE — Progress Notes (Signed)
Cardiology Office Note:   Date:  03/07/2019  NAME:  Tanner [name]    MRN: [id number] DOB:  [birth year]   PCP:  Antony Contras, MD  Cardiologist:  Evalina Field, MD   Referring MD: Antony Contras, MD   Chief Complaint  Patient presents with   Shortness of Breath     issue with blood pressure   History of Present Illness:   Tanner [name] is a 73 y.o. male with a hx of hypertension, hyperlipidemia, abdominal aortic ectasia who is being seen today for the evaluation of shortness of breath at the request of Antony Contras, MD. he presents today for evaluation of shortness of breath, and high blood pressure.  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  He is followed by his primary care physician who was recently started amlodipine, and this is been increased to 10 mg daily.  He remains on losartan 25 mg daily.  He checks his blood pressure several times per week with values ranging in the [id number] range systolic.  He reports he feels well with these values, but his primary care physician has plans for a goal less than 120.  He reports recent COVID-19 diagnosis, that appears to be relatively asymptomatic.  He was tested due to a recent family member was reported to have had COVID-35.  Since that diagnosis, and with his quarantine he is noticed some increased shortness of breath with exertion.  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  He states he is able to walk up to 1 mile, and climb several flights of stairs daily without limitations.  However, he has noted some increased fatigue after this that is quite unusual for him.  The symptoms appear to have started a few weeks ago, and appear to be stable.  He reports no associated chest pain/pressure, palpitations, or any other symptoms.  He denies a prior history of myocardial infarction, stroke, congestive heart failure.  He is a former smoker but  quit 20+ years ago.  He reports occasional alcohol use, no abuse at all.  He reports a brother had heart disease, but he is unclear what this disease whether he passed at the age of 63.  Most recent lab work from his primary care physician demonstrates normal kidney function, and LDL cholesterol of 103.  Most recent A1c value noted to be 6.7, and he is on metformin.  He reports he exercises several times per week and remains quite active for his age.  The only noticeable symptom is the increase shortness of breath over the past few weeks.  EKG demonstrates normal sinus rhythm, with frequent ventricular ectopy, no ST-T changes to suggest ischemia or prior infarction.  Past Medical History: Past Medical History:  Diagnosis Date   Diabetes mellitus without complication ([place])    Diverticulitis of colon    Erectile dysfunction    Hypertension    Small bowel obstruction ([facility])     Past Surgical History: Past Surgical History:  Procedure Laterality Date   ABDOMINAL SURGERY     BOWEL RESECTION     CHOLECYSTECTOMY     CHOLECYSTECTOMY OPEN     COLOSTOMY TAKEDOWN  1980s    Current Medications: Current Meds  Medication Sig   amLODipine (NORVASC) 10 MG tablet Take 10 mg by mouth daily.   losartan (COZAAR) 50 MG tablet Take 1 tablet (50 mg total) by mouth  daily.   metFORMIN (GLUCOPHAGE) 500 MG tablet Take 1 tablet (500 mg total) by mouth 2 (two) times daily with a meal.   pravastatin (PRAVACHOL) 10 MG tablet Take 10 mg by mouth daily.   [DISCONTINUED] losartan (COZAAR) 25 MG tablet Take 25 mg by mouth daily.     Allergies:    Prednisone   Social History: Social History   Socioeconomic History   Marital status: Married    Spouse name: Not on file   Number of children: Not on file   Years of education: Not on file   Highest education level: Not on file  Occupational History   Not on file  Social Needs   Financial resource strain: Not on file   Food insecurity     Worry: Not on file    Inability: Not on file   Transportation needs    Medical: Not on file    Non-medical: Not on file  Tobacco Use   Smoking status: Never Smoker   Smokeless tobacco: Never Used  Substance and Sexual Activity   Alcohol use: No   Drug use: No   Sexual activity: Yes    Birth control/protection: None  Lifestyle   Physical activity    Days per week: Not on file    Minutes per session: Not on file   Stress: Not on file  Relationships   Social connections    Talks on phone: Not on file    Gets together: Not on file    Attends religious service: Not on file    Active member of club or organization: Not on file    Attends meetings of clubs or organizations: Not on file    Relationship status: Not on file  Other Topics Concern   Not on file  Social History Narrative   Not on file    Family History: The patient's family history includes Cancer in his mother; Dementia in his father; Heart disease in his brother; Stroke in his mother.  ROS:   All other ROS reviewed and negative. Pertinent positives noted in the HPI.     EKGs/Labs/Other Studies Reviewed:   The following studies were personally reviewed by me today: Labs from PCP (01/2019): Cr 1.09, T chol 184 HDL 39 LDL 103, A1c 6.7  EKG:  EKG is ordered today.  The ekg ordered today demonstrates normal sinus rhythm, normal intervals, no ischemic ST-T changes frequent PVCs noted, and was personally reviewed by me.   Recent Labs: No results found for requested labs within last 8760 hours.   Recent Lipid Panel No results found for: CHOL, TRIG, HDL, CHOLHDL, VLDL, LDLCALC, LDLDIRECT  Physical Exam:   VS:  BP (!) 120/50 (BP Location: Right Arm, Cuff Size: Normal)    Pulse 77    Temp (!) 97 F (36.1 C)    Ht 5\' 10"  (1.778 m)    Wt 222 lb 3.2 oz (100.8 kg)    BMI 31.88 kg/m    Wt Readings from Last 3 Encounters:  03/07/19 222 lb 3.2 oz (100.8 kg)  04/22/17 225 lb (102.1 kg)  07/19/16 220 lb (99.8 kg)     General: Well nourished, well developed, in no acute distress Heart: Atraumatic, normal size  Eyes: PEERLA, EOMI  Neck: Supple, no JVD Endocrine: No thryomegaly Cardiac: Normal S1, S2; RRR; no murmurs, rubs, or gallops Lungs: Clear to auscultation bilaterally, no wheezing, rhonchi or rales  Abd: Soft, nontender, no hepatomegaly  Ext: No edema, pulses 2+ Musculoskeletal: No deformities,  BUE and BLE strength normal and equal Skin: Warm and dry, no rashes   Neuro: Alert and oriented to person, place, time, and situation, CNII-XII grossly intact, no focal deficits  Psych: Normal mood and affect   ASSESSMENT:   NAME@ is a 73 y.o. male who presents for the following: 1. SOB (shortness of breath) on exertion   2. PVC (premature ventricular contraction)   3. Nonspecific abnormal electrocardiogram (ECG) (EKG)   4. Essential hypertension     PLAN:   1. SOB (shortness of breath) on exertion 2. PVC (premature ventricular contraction) 3. Nonspecific abnormal electrocardiogram (ECG) (EKG) -Reports a recent change in his exercise tolerance, with associated shortness of breath with exertion.  He does report he is more fatigued than usual with walking his usual amount.  He is still able to climb several flights of stairs each day at work without major limitations.  He has no evidence of clinical heart failure exam, and his EKG is rather unremarkable except extra heartbeats (PVCs).  Furthermore, he reports a recent diagnosis of COVID-19, and a period of inactivity.  Therefore a shortness of breath could be related to recent deconditioning, versus worsening PVCs with exercise. -We will obtain an echocardiogram to ensure he has no structural heart disease -He will continue to exercise and remain active in the interim -I will follow-up with him over phone the results of the echocardiogram, and if normal he will see [place] on an as-needed basis  4. Essential hypertension -Blood pressure goal for Mr. Tollie Pizza  given diabetes should be less than 130/80 -His blood pressure today was 140/58, and on recheck 120/50 -His personal logs demonstrate that his values range from the 115-147 range, with fluctuation during the day which is expected -A simple fix today, will be to increase his losartan to 50 mg daily -He will continue to monitor his blood pressure at home, including symptoms -I informed him that he can follow with his primary care physician for further management blood pressure, and we will see what his echocardiogram shows to determine if he needs further cardiology follow-up   Disposition: Return if symptoms worsen or fail to improve.  Medication Adjustments/Labs and Tests Ordered: Current medicines are reviewed at length with the patient today.  Concerns regarding medicines are outlined above.  Orders Placed This Encounter  Procedures   ECHOCARDIOGRAM COMPLETE   Meds ordered this encounter  Medications   losartan (COZAAR) 50 MG tablet    Sig: Take 1 tablet (50 mg total) by mouth daily.    Dispense:  90 tablet    Refill:  3    Patient Instructions  Medication Instructions: You will increase your losartan to 50 mg daily. Please take 2 tablets of your current dose (25 mg) and at your next refill your will resume 1 tablet daily.     If you need a refill on your cardiac medications before your next appointment, please call your pharmacy.   Lab work:     If you have labs (blood work) drawn today and your tests are completely normal, you will receive your results only by:  [place] (if you have MyChart) OR  A paper copy in the mail If you have any lab test that is abnormal or we need to change your treatment, we will call you to review the results.  Testing/Procedures: Will schedule at [place] has requested that you have an echocardiogram. Echocardiography is a painless test that uses sound  waves to create images of your heart.  It provides your doctor with information about the size and shape of your heart and how well your hearts chambers and valves are working. This procedure takes approximately one hour. There are no restrictions for this procedure.    Follow-Up: At [facility], you and your health needs are our priority.  As part of our continuing mission to provide you with exceptional heart care, we have created designated Provider Care Teams.  These Care Teams include your primary Cardiologist (physician) and Advanced Practice Providers (APPs -  Physician Assistants and Nurse Practitioners) who all work together to provide you with the care you need, when you need it.   Dr Audie Box recommends that you schedule a follow-up appointment in:      Any Other Special Instructions Will Be Listed Below (If Applicable).      Signed, Addison Naegeli. Audie Box, [place]  [address] [place],  [address] [phone]  03/07/2019 9:00 AM

## 2019-03-07 NOTE — Patient Instructions (Addendum)
Medication Instructions: You will increase your losartan to 50 mg daily. Please take 2 tablets of your current dose (25 mg) and at your next refill your will resume 1 tablet daily.     If you need a refill on your cardiac medications before your next appointment, please call your pharmacy.   Lab work:     If you have labs (blood work) drawn today and your tests are completely normal, you will receive your results only by: [place] MyChart Message (if you have MyChart) OR . A paper copy in the mail If you have any lab test that is abnormal or we need to change your treatment, we will call you to review the results.  Testing/Procedures: Will schedule at [facility] has requested that you have an echocardiogram. Echocardiography is a painless test that uses sound waves to create images of your heart. It provides your doctor with information about the size and shape of your heart and how well your heart's chambers and valves are working. This procedure takes approximately one hour. There are no restrictions for this procedure.    Follow-Up: At [facility], you and your health needs are our priority.  As part of our continuing mission to provide you with exceptional heart care, we have created designated Provider Care Teams.  These Care Teams include your primary Cardiologist (physician) and Advanced Practice Providers (APPs -  Physician Assistants and Nurse Practitioners) who all work together to provide you with the care you need, when you need it.  . Dr Audie Box recommends that you schedule a follow-up appointment in:      Any Other Special Instructions Will Be Listed Below (If Applicable).

## 2019-03-10 ENCOUNTER — Telehealth: Payer: Self-pay | Admitting: Cardiovascular Disease

## 2019-03-10 ENCOUNTER — Other Ambulatory Visit (INDEPENDENT_AMBULATORY_CARE_PROVIDER_SITE_OTHER): Payer: PPO

## 2019-03-10 DIAGNOSIS — R0602 Shortness of breath: Secondary | ICD-10-CM

## 2019-03-10 DIAGNOSIS — I493 Ventricular premature depolarization: Secondary | ICD-10-CM

## 2019-03-10 DIAGNOSIS — I10 Essential (primary) hypertension: Secondary | ICD-10-CM

## 2019-03-10 DIAGNOSIS — R9431 Abnormal electrocardiogram [ECG] [EKG]: Secondary | ICD-10-CM

## 2019-03-10 MED ORDER — LOSARTAN POTASSIUM 100 MG PO TABS: 100.0000 mg | ORAL_TABLET | Freq: Every day | ORAL | Status: AC

## 2019-03-10 NOTE — Telephone Encounter (Signed)
It is fine for him to take the dose prescribed by his PCP.   Evalina Field, MD

## 2019-03-10 NOTE — Telephone Encounter (Signed)
Is he taking 100 mg? -W

## 2019-03-10 NOTE — Telephone Encounter (Signed)
Returned call to pt he states that he went home and noticed that he is currently taking 100mg  daily his PCP had him on this dose. Do you want to increase? Please advise

## 2019-03-10 NOTE — Telephone Encounter (Signed)
Noted  

## 2019-03-10 NOTE — Telephone Encounter (Signed)
Yes

## 2019-03-10 NOTE — Telephone Encounter (Signed)
100 is max dose. Ok to let it ride for now.  Tanner Field, MD

## 2019-03-10 NOTE — Telephone Encounter (Signed)
New Message  Pt c/o medication issue:  1. Name of Medication: losartan (COZAAR) 50 MG tablet  2. How are you currently taking this medication (dosage and times per day)? 1 tablet (100 mg) by mouth daily  3. Are you having a reaction (difficulty breathing--STAT)? No  4. What is your medication issue?  Patient states that he was already taking 100 mg daily of the Losartan before his visit on 03/07/19. States that a mistake was made and Losartan prescription was changed to 50 mg daily. Patient would like the prescription to be changed back to 100 mg daily.

## 2019-03-10 NOTE — Telephone Encounter (Signed)
Returned call to pt. Pt's BP is still on the high side last night it is 142/77, did you want to increase? He will continue to take BP daily and log BP for review

## 2019-03-12 ENCOUNTER — Ambulatory Visit (HOSPITAL_COMMUNITY): Payer: PPO | Attending: Cardiovascular Disease

## 2019-03-12 ENCOUNTER — Other Ambulatory Visit: Payer: Self-pay

## 2019-03-12 DIAGNOSIS — R0602 Shortness of breath: Secondary | ICD-10-CM

## 2019-04-01 DIAGNOSIS — C44712 Basal cell carcinoma of skin of right lower limb, including hip: Secondary | ICD-10-CM | POA: Diagnosis not present

## 2019-04-01 DIAGNOSIS — D485 Neoplasm of uncertain behavior of skin: Secondary | ICD-10-CM | POA: Diagnosis not present

## 2019-04-01 DIAGNOSIS — L821 Other seborrheic keratosis: Secondary | ICD-10-CM | POA: Diagnosis not present

## 2019-04-01 DIAGNOSIS — D1801 Hemangioma of skin and subcutaneous tissue: Secondary | ICD-10-CM | POA: Diagnosis not present

## 2019-04-01 DIAGNOSIS — D225 Melanocytic nevi of trunk: Secondary | ICD-10-CM | POA: Diagnosis not present

## 2019-04-14 DIAGNOSIS — L821 Other seborrheic keratosis: Secondary | ICD-10-CM | POA: Diagnosis not present

## 2019-04-14 DIAGNOSIS — C44712 Basal cell carcinoma of skin of right lower limb, including hip: Secondary | ICD-10-CM | POA: Diagnosis not present

## 2019-05-13 ENCOUNTER — Encounter (INDEPENDENT_AMBULATORY_CARE_PROVIDER_SITE_OTHER): Payer: Self-pay

## 2019-05-19 DIAGNOSIS — Z8619 Personal history of other infectious and parasitic diseases: Secondary | ICD-10-CM | POA: Diagnosis not present

## 2019-05-19 DIAGNOSIS — Z8719 Personal history of other diseases of the digestive system: Secondary | ICD-10-CM | POA: Diagnosis not present

## 2019-05-19 DIAGNOSIS — I77811 Abdominal aortic ectasia: Secondary | ICD-10-CM | POA: Diagnosis not present

## 2019-05-19 DIAGNOSIS — Z87898 Personal history of other specified conditions: Secondary | ICD-10-CM | POA: Diagnosis not present

## 2019-05-19 DIAGNOSIS — I10 Essential (primary) hypertension: Secondary | ICD-10-CM | POA: Diagnosis not present

## 2019-05-19 DIAGNOSIS — E782 Mixed hyperlipidemia: Secondary | ICD-10-CM | POA: Diagnosis not present

## 2019-05-19 DIAGNOSIS — E1169 Type 2 diabetes mellitus with other specified complication: Secondary | ICD-10-CM | POA: Diagnosis not present

## 2019-05-19 DIAGNOSIS — E291 Testicular hypofunction: Secondary | ICD-10-CM | POA: Diagnosis not present

## 2019-05-19 DIAGNOSIS — Z0000 Encounter for general adult medical examination without abnormal findings: Secondary | ICD-10-CM | POA: Diagnosis not present

## 2019-05-19 DIAGNOSIS — D696 Thrombocytopenia, unspecified: Secondary | ICD-10-CM | POA: Diagnosis not present

## 2019-05-19 DIAGNOSIS — R0600 Dyspnea, unspecified: Secondary | ICD-10-CM | POA: Diagnosis not present

## 2019-05-19 DIAGNOSIS — N529 Male erectile dysfunction, unspecified: Secondary | ICD-10-CM | POA: Diagnosis not present

## 2019-06-09 DIAGNOSIS — I10 Essential (primary) hypertension: Secondary | ICD-10-CM | POA: Diagnosis not present

## 2019-06-09 DIAGNOSIS — E1169 Type 2 diabetes mellitus with other specified complication: Secondary | ICD-10-CM | POA: Diagnosis not present

## 2019-06-09 DIAGNOSIS — E782 Mixed hyperlipidemia: Secondary | ICD-10-CM | POA: Diagnosis not present

## 2019-06-09 DIAGNOSIS — E119 Type 2 diabetes mellitus without complications: Secondary | ICD-10-CM | POA: Diagnosis not present

## 2019-06-27 DIAGNOSIS — N400 Enlarged prostate without lower urinary tract symptoms: Secondary | ICD-10-CM | POA: Diagnosis not present

## 2019-06-27 DIAGNOSIS — N5201 Erectile dysfunction due to arterial insufficiency: Secondary | ICD-10-CM | POA: Diagnosis not present

## 2019-07-14 DIAGNOSIS — L905 Scar conditions and fibrosis of skin: Secondary | ICD-10-CM | POA: Diagnosis not present

## 2019-07-14 DIAGNOSIS — Z85828 Personal history of other malignant neoplasm of skin: Secondary | ICD-10-CM | POA: Diagnosis not present

## 2019-08-28 DIAGNOSIS — E1169 Type 2 diabetes mellitus with other specified complication: Secondary | ICD-10-CM | POA: Diagnosis not present

## 2019-08-28 DIAGNOSIS — Z7984 Long term (current) use of oral hypoglycemic drugs: Secondary | ICD-10-CM | POA: Diagnosis not present

## 2019-08-28 DIAGNOSIS — I10 Essential (primary) hypertension: Secondary | ICD-10-CM | POA: Diagnosis not present

## 2019-08-28 DIAGNOSIS — E782 Mixed hyperlipidemia: Secondary | ICD-10-CM | POA: Diagnosis not present

## 2019-08-28 DIAGNOSIS — E119 Type 2 diabetes mellitus without complications: Secondary | ICD-10-CM | POA: Diagnosis not present

## 2019-11-03 DIAGNOSIS — E782 Mixed hyperlipidemia: Secondary | ICD-10-CM | POA: Diagnosis not present

## 2019-11-03 DIAGNOSIS — E1169 Type 2 diabetes mellitus with other specified complication: Secondary | ICD-10-CM | POA: Diagnosis not present

## 2019-11-03 DIAGNOSIS — E119 Type 2 diabetes mellitus without complications: Secondary | ICD-10-CM | POA: Diagnosis not present

## 2019-11-03 DIAGNOSIS — Z7984 Long term (current) use of oral hypoglycemic drugs: Secondary | ICD-10-CM | POA: Diagnosis not present

## 2019-11-03 DIAGNOSIS — G4700 Insomnia, unspecified: Secondary | ICD-10-CM | POA: Diagnosis not present

## 2019-11-03 DIAGNOSIS — I10 Essential (primary) hypertension: Secondary | ICD-10-CM | POA: Diagnosis not present

## 2019-11-05 ENCOUNTER — Other Ambulatory Visit: Payer: Self-pay | Admitting: Family Medicine

## 2019-11-05 DIAGNOSIS — I77811 Abdominal aortic ectasia: Secondary | ICD-10-CM

## 2019-11-12 ENCOUNTER — Ambulatory Visit
Admission: RE | Admit: 2019-11-12 | Discharge: 2019-11-12 | Disposition: A | Discharge status: Home / Self Care | Payer: PPO | Source: Ambulatory Visit | Attending: Family Medicine | Admitting: Family Medicine

## 2019-11-12 DIAGNOSIS — I77811 Abdominal aortic ectasia: Secondary | ICD-10-CM | POA: Diagnosis not present

## 2019-11-17 DIAGNOSIS — I10 Essential (primary) hypertension: Secondary | ICD-10-CM | POA: Diagnosis not present

## 2019-11-17 DIAGNOSIS — E291 Testicular hypofunction: Secondary | ICD-10-CM | POA: Diagnosis not present

## 2019-11-17 DIAGNOSIS — Z8719 Personal history of other diseases of the digestive system: Secondary | ICD-10-CM | POA: Diagnosis not present

## 2019-11-17 DIAGNOSIS — Z7984 Long term (current) use of oral hypoglycemic drugs: Secondary | ICD-10-CM | POA: Diagnosis not present

## 2019-11-17 DIAGNOSIS — Z8619 Personal history of other infectious and parasitic diseases: Secondary | ICD-10-CM | POA: Diagnosis not present

## 2019-11-17 DIAGNOSIS — E782 Mixed hyperlipidemia: Secondary | ICD-10-CM | POA: Diagnosis not present

## 2019-11-17 DIAGNOSIS — I77811 Abdominal aortic ectasia: Secondary | ICD-10-CM | POA: Diagnosis not present

## 2019-11-17 DIAGNOSIS — Z1211 Encounter for screening for malignant neoplasm of colon: Secondary | ICD-10-CM | POA: Diagnosis not present

## 2019-11-17 DIAGNOSIS — E1169 Type 2 diabetes mellitus with other specified complication: Secondary | ICD-10-CM | POA: Diagnosis not present

## 2019-11-17 DIAGNOSIS — D696 Thrombocytopenia, unspecified: Secondary | ICD-10-CM | POA: Diagnosis not present

## 2019-11-17 DIAGNOSIS — N529 Male erectile dysfunction, unspecified: Secondary | ICD-10-CM | POA: Diagnosis not present

## 2019-11-17 DIAGNOSIS — Z87898 Personal history of other specified conditions: Secondary | ICD-10-CM | POA: Diagnosis not present

## 2019-11-25 DIAGNOSIS — Z1211 Encounter for screening for malignant neoplasm of colon: Secondary | ICD-10-CM | POA: Diagnosis not present

## 2019-12-12 DIAGNOSIS — E1169 Type 2 diabetes mellitus with other specified complication: Secondary | ICD-10-CM | POA: Diagnosis not present

## 2019-12-12 DIAGNOSIS — G4700 Insomnia, unspecified: Secondary | ICD-10-CM | POA: Diagnosis not present

## 2019-12-12 DIAGNOSIS — I10 Essential (primary) hypertension: Secondary | ICD-10-CM | POA: Diagnosis not present

## 2019-12-12 DIAGNOSIS — E119 Type 2 diabetes mellitus without complications: Secondary | ICD-10-CM | POA: Diagnosis not present

## 2019-12-12 DIAGNOSIS — E782 Mixed hyperlipidemia: Secondary | ICD-10-CM | POA: Diagnosis not present

## 2019-12-29 DIAGNOSIS — H25012 Cortical age-related cataract, left eye: Secondary | ICD-10-CM | POA: Diagnosis not present

## 2019-12-29 DIAGNOSIS — H2512 Age-related nuclear cataract, left eye: Secondary | ICD-10-CM | POA: Diagnosis not present

## 2019-12-29 DIAGNOSIS — H524 Presbyopia: Secondary | ICD-10-CM | POA: Diagnosis not present

## 2019-12-29 DIAGNOSIS — Z961 Presence of intraocular lens: Secondary | ICD-10-CM | POA: Diagnosis not present

## 2020-01-23 DIAGNOSIS — E1169 Type 2 diabetes mellitus with other specified complication: Secondary | ICD-10-CM | POA: Diagnosis not present

## 2020-01-23 DIAGNOSIS — I10 Essential (primary) hypertension: Secondary | ICD-10-CM | POA: Diagnosis not present

## 2020-01-23 DIAGNOSIS — E782 Mixed hyperlipidemia: Secondary | ICD-10-CM | POA: Diagnosis not present

## 2020-01-23 DIAGNOSIS — E119 Type 2 diabetes mellitus without complications: Secondary | ICD-10-CM | POA: Diagnosis not present

## 2020-01-23 DIAGNOSIS — G4700 Insomnia, unspecified: Secondary | ICD-10-CM | POA: Diagnosis not present

## 2020-03-07 ENCOUNTER — Other Ambulatory Visit: Payer: Self-pay

## 2020-03-07 ENCOUNTER — Ambulatory Visit
Admission: EM | Admit: 2020-03-07 | Discharge: 2020-03-07 | Disposition: A | Discharge status: Home / Self Care | Payer: PPO | Attending: Physician Assistant | Admitting: Physician Assistant

## 2020-03-07 DIAGNOSIS — M7989 Other specified soft tissue disorders: Secondary | ICD-10-CM | POA: Diagnosis not present

## 2020-03-07 DIAGNOSIS — T63441A Toxic effect of venom of bees, accidental (unintentional), initial encounter: Secondary | ICD-10-CM

## 2020-03-07 MED ORDER — DEXAMETHASONE SODIUM PHOSPHATE 10 MG/ML IJ SOLN
10.0000 mg | Freq: Once | INTRAMUSCULAR | Status: AC
  Administered 2020-03-07: 10 mg via INTRAMUSCULAR

## 2020-03-07 NOTE — Discharge Instructions (Signed)
Decadron injection in office today. Ice compress, benadryl as directed. Follow up with PCP if symptoms not improving.

## 2020-03-07 NOTE — ED Triage Notes (Signed)
Pt c/o bee sting to right arm yesterday. He states he was stung in the same arm almost a week ago.

## 2020-03-07 NOTE — ED Provider Notes (Signed)
[facility]    CSN: [id number] Arrival date & time: 03/07/20  1459      History   Chief Complaint Chief Complaint  Patient presents with  . Insect Bite    bee sting on right arm    HPI Tanner [name] is a 74 y.o. male.   74 year old male with history of DM, HTN comes in for right arm swelling after bee sting yesterday.  States had another incident of bee sting to the radial wrist last week, with minimal swelling.  Yesterday, had bee sting to the finger, with localized swelling.  States woke up this morning with diffuse swelling of the hand and forearm.  Denies any pain, numbness, tingling.  Ice compress, OTC topical medication without relief.  Patient with history of DM, well controlled.     Past Medical History:  Diagnosis Date  . Diabetes mellitus without complication ([place])   . Diverticulitis of colon   . Erectile dysfunction   . Hypertension   . Small bowel obstruction [facility])     Patient Active Problem List   Diagnosis Date Noted  . Controlled type 2 diabetes mellitus without complication ([place]) 37/62/8315  . SBO (small bowel obstruction) ([place]) 11/26/2014  . Diverticulosis of colon without hemorrhage 11/26/2014  . Hyperglycemia 11/26/2014  . DDD (degenerative disc disease), cervical 10/23/2014    Past Surgical History:  Procedure Laterality Date  . ABDOMINAL SURGERY    . BOWEL RESECTION    . CHOLECYSTECTOMY    . CHOLECYSTECTOMY OPEN    . COLOSTOMY TAKEDOWN  1980s       Home Medications    Prior to Admission medications   Medication Sig Start Date End Date Taking? Authorizing Provider  amLODipine (NORVASC) 10 MG tablet Take 10 mg by mouth daily.    [provider]  losartan (COZAAR) 100 MG tablet Take 1 tablet (100 mg total) by mouth daily. 03/10/19   Geralynn Rile, MD  metFORMIN (GLUCOPHAGE) 500 MG tablet Take 1 tablet (500 mg total) by mouth 2 (two) times daily with a meal. 11/30/14   Charlynne Cousins, MD    pravastatin (PRAVACHOL) 10 MG tablet Take 10 mg by mouth daily.    [provider]    Family History Family History  Problem Relation Age of Onset  . Cancer Mother   . Stroke Mother   . Dementia Father   . Heart disease Brother     Social History Social History   Tobacco Use  . Smoking status: Never Smoker  . Smokeless tobacco: Never Used  Substance Use Topics  . Alcohol use: No  . Drug use: No     Allergies   Azithromycin and Prednisone   Review of Systems Review of Systems  Reason unable to perform ROS: See HPI as above.     Physical Exam Triage Vital Signs ED Triage Vitals  Enc Vitals Group     BP 03/07/20 1556 135/71     Pulse Rate 03/07/20 1556 91     Resp 03/07/20 1556 18     Temp 03/07/20 1556 97.6 F (36.4 C)     Temp Source 03/07/20 1556 Oral     SpO2 03/07/20 1556 95 %     Weight --      Height --      Head Circumference --      Peak Flow --      Pain Score 03/07/20 1619 0     Pain Loc --  Pain Edu? --      Excl. in [place]? --    No data found.  Updated Vital Signs BP 135/71 (BP Location: Left Arm)   Pulse 91   Temp 97.6 F (36.4 C) (Oral)   Resp 18   SpO2 95%   Physical Exam Constitutional:      General: He is not in acute distress.    Appearance: Normal appearance. He is well-developed. He is not toxic-appearing or diaphoretic.  HENT:     Head: Normocephalic and atraumatic.  Eyes:     Conjunctiva/sclera: Conjunctivae normal.     Pupils: Pupils are equal, round, and reactive to light.  Pulmonary:     Effort: Pulmonary effort is normal. No respiratory distress.  Musculoskeletal:     Cervical back: Normal range of motion and neck supple.     Comments: Diffuse swelling of right hand and distal forearm with 1+ pitting edema.  No erythema, warmth.  No tenderness to palpation.  Full range of motion. NVI  Skin:    General: Skin is warm and dry.  Neurological:     Mental Status: He is alert and oriented to person, place,  and time.      UC Treatments / Results  Labs (all labs ordered are listed, but only abnormal results are displayed) Labs Reviewed - No data to display  EKG   Radiology No results found.  Procedures Procedures (including critical care time)  Medications Ordered in UC Medications  dexamethasone (DECADRON) injection 10 mg (10 mg Intramuscular Given 03/07/20 1635)    Initial Impression / Assessment and Plan / UC Course  I have reviewed the triage vital signs and the nursing notes.  Pertinent labs & imaging results that were available during my care of the patient were reviewed by me and considered in my medical decision making (see chart for details).    Patient states with history of anxious feeling after given prednisone and azithromycin. States okay to trial decadron injection for current symptoms. Decadron in office today. Continue symptomatic management. Return precautions given.  Final Clinical Impressions(s) / UC Diagnoses   Final diagnoses:  Swelling of right hand  Bee sting reaction, accidental or unintentional, initial encounter   ED Prescriptions    None     PDMP not reviewed this encounter.   Ok Edwards, PA-C 03/07/20 [phone]

## 2020-03-18 DIAGNOSIS — E1169 Type 2 diabetes mellitus with other specified complication: Secondary | ICD-10-CM | POA: Diagnosis not present

## 2020-03-18 DIAGNOSIS — E782 Mixed hyperlipidemia: Secondary | ICD-10-CM | POA: Diagnosis not present

## 2020-03-18 DIAGNOSIS — I10 Essential (primary) hypertension: Secondary | ICD-10-CM | POA: Diagnosis not present

## 2020-03-18 DIAGNOSIS — E119 Type 2 diabetes mellitus without complications: Secondary | ICD-10-CM | POA: Diagnosis not present

## 2020-03-18 DIAGNOSIS — G4700 Insomnia, unspecified: Secondary | ICD-10-CM | POA: Diagnosis not present

## 2020-04-08 ENCOUNTER — Other Ambulatory Visit: Payer: Self-pay

## 2020-04-08 ENCOUNTER — Ambulatory Visit (INDEPENDENT_AMBULATORY_CARE_PROVIDER_SITE_OTHER): Payer: PPO | Admitting: Otolaryngology

## 2020-04-08 ENCOUNTER — Encounter (INDEPENDENT_AMBULATORY_CARE_PROVIDER_SITE_OTHER): Payer: Self-pay | Admitting: Otolaryngology

## 2020-04-08 VITALS — Temp 97.2°F

## 2020-04-08 DIAGNOSIS — H90A21 Sensorineural hearing loss, unilateral, right ear, with restricted hearing on the contralateral side: Secondary | ICD-10-CM | POA: Diagnosis not present

## 2020-04-08 DIAGNOSIS — H6121 Impacted cerumen, right ear: Secondary | ICD-10-CM | POA: Diagnosis not present

## 2020-04-08 NOTE — Progress Notes (Signed)
HPI: Tanner [name] is a 74 y.o. male who returns today for evaluation of wax buildup in his right ear.  This is his only hearing ear which he wears a hearing aid in.  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  Past Medical History:  Diagnosis Date  . Diabetes mellitus without complication ([place])   . Diverticulitis of colon   . Erectile dysfunction   . Hypertension   . Small bowel obstruction [facility])    Past Surgical History:  Procedure Laterality Date  . ABDOMINAL SURGERY    . BOWEL RESECTION    . CHOLECYSTECTOMY    . CHOLECYSTECTOMY OPEN    . COLOSTOMY TAKEDOWN  60s   Social History   Socioeconomic History  . Marital status: Married    Spouse name: Not on file  . Number of children: Not on file  . Years of education: Not on file  . Highest education level: Not on file  Occupational History  . Not on file  Tobacco Use  . Smoking status: Never Smoker  . Smokeless tobacco: Never Used  Substance and Sexual Activity  . Alcohol use: No  . Drug use: No  . Sexual activity: Yes    Birth control/protection: None  Other Topics Concern  . Not on file  Social History Narrative  . Not on file   Social Determinants of Health   Financial Resource Strain:   . Difficulty of Paying Living Expenses: Not on file  Food Insecurity:   . Worried About Charity fundraiser in the Last Year: Not on file  . Ran Out of Food in the Last Year: Not on file  Transportation Needs:   . Lack of Transportation (Medical): Not on file  . Lack of Transportation (Non-Medical): Not on file  Physical Activity:   . Days of Exercise per Week: Not on file  . Minutes of Exercise per Session: Not on file  Stress:   . Feeling of Stress : Not on file  Social Connections:   . Frequency of Communication with Friends and Family: Not on file  . Frequency of Social Gatherings with Friends and Family: Not on file  . Attends Religious Services: Not on file  . Active Member of Clubs or Organizations:  Not on file  . Attends Archivist Meetings: Not on file  . Marital Status: Not on file   Family History  Problem Relation Age of Onset  . Cancer Mother   . Stroke Mother   . Dementia Father   . Heart disease Brother    Allergies  Allergen Reactions  . Azithromycin   . Prednisone     anxious   Prior to Admission medications   Medication Sig Start Date End Date Taking? Authorizing Provider  amLODipine (NORVASC) 10 MG tablet Take 10 mg by mouth daily.   Yes [provider]  losartan (COZAAR) 100 MG tablet Take 1 tablet (100 mg total) by mouth daily. 03/10/19  Yes O'Neal, Cassie Freer, MD  metFORMIN (GLUCOPHAGE) 500 MG tablet Take 1 tablet (500 mg total) by mouth 2 (two) times daily with a meal. 11/30/14  Yes Charlynne Cousins, MD  pravastatin (PRAVACHOL) 10 MG tablet Take 10 mg by mouth daily.   Yes [provider]     Positive ROS: Otherwise negative  All other systems have been reviewed and were otherwise negative with the exception of those mentioned in the HPI and as above.  Physical Exam: Constitutional: Alert, well-appearing,  no acute distress Ears: External ears without lesions or tenderness.  Right ear canal with a mild amount of wax buildup in the ear canal that was removed with forceps and suction.  The right TM is clear.  Left ear canal and left TM are clear. Nasal: External nose without lesions. Clear nasal passages Oral: Lips and gums without lesions. Tongue and palate mucosa without lesions. Posterior oropharynx clear. Neck: No palpable adenopathy or masses Respiratory: Breathing comfortably  Skin: No facial/neck lesions or rash noted.  Cerumen impaction removal  Date/Time: 04/08/2020 4:34 PM Performed by: Rozetta Nunnery, MD Authorized by: Rozetta Nunnery, MD   Consent:    Consent obtained:  Verbal   Consent given by:  Patient   Risks discussed:  Pain and bleeding Procedure details:    Location:  R ear   Procedure  type: curette, suction and forceps   Post-procedure details:    Inspection:  TM intact and canal normal   Hearing quality:  Improved   Patient tolerance of procedure:  Tolerated well, no immediate complications Comments:     TMs are clear bilaterally    Assessment: Right cerumen buildup. 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 He will follow-up as needed.   [name], MD

## 2020-05-03 DIAGNOSIS — L905 Scar conditions and fibrosis of skin: Secondary | ICD-10-CM | POA: Diagnosis not present

## 2020-05-03 DIAGNOSIS — D225 Melanocytic nevi of trunk: Secondary | ICD-10-CM | POA: Diagnosis not present

## 2020-05-03 DIAGNOSIS — Z85828 Personal history of other malignant neoplasm of skin: Secondary | ICD-10-CM | POA: Diagnosis not present

## 2020-05-03 DIAGNOSIS — D1801 Hemangioma of skin and subcutaneous tissue: Secondary | ICD-10-CM | POA: Diagnosis not present

## 2020-05-03 DIAGNOSIS — L821 Other seborrheic keratosis: Secondary | ICD-10-CM | POA: Diagnosis not present

## 2020-05-03 DIAGNOSIS — L814 Other melanin hyperpigmentation: Secondary | ICD-10-CM | POA: Diagnosis not present

## 2020-05-28 DIAGNOSIS — R0600 Dyspnea, unspecified: Secondary | ICD-10-CM | POA: Diagnosis not present

## 2020-05-28 DIAGNOSIS — E291 Testicular hypofunction: Secondary | ICD-10-CM | POA: Diagnosis not present

## 2020-05-28 DIAGNOSIS — E782 Mixed hyperlipidemia: Secondary | ICD-10-CM | POA: Diagnosis not present

## 2020-05-28 DIAGNOSIS — E1169 Type 2 diabetes mellitus with other specified complication: Secondary | ICD-10-CM | POA: Diagnosis not present

## 2020-05-28 DIAGNOSIS — Z8619 Personal history of other infectious and parasitic diseases: Secondary | ICD-10-CM | POA: Diagnosis not present

## 2020-05-28 DIAGNOSIS — Z8719 Personal history of other diseases of the digestive system: Secondary | ICD-10-CM | POA: Diagnosis not present

## 2020-05-28 DIAGNOSIS — Z1389 Encounter for screening for other disorder: Secondary | ICD-10-CM | POA: Diagnosis not present

## 2020-05-28 DIAGNOSIS — I10 Essential (primary) hypertension: Secondary | ICD-10-CM | POA: Diagnosis not present

## 2020-05-28 DIAGNOSIS — I77811 Abdominal aortic ectasia: Secondary | ICD-10-CM | POA: Diagnosis not present

## 2020-05-28 DIAGNOSIS — D696 Thrombocytopenia, unspecified: Secondary | ICD-10-CM | POA: Diagnosis not present

## 2020-05-28 DIAGNOSIS — Z87898 Personal history of other specified conditions: Secondary | ICD-10-CM | POA: Diagnosis not present

## 2020-05-28 DIAGNOSIS — Z0000 Encounter for general adult medical examination without abnormal findings: Secondary | ICD-10-CM | POA: Diagnosis not present

## 2020-05-28 DIAGNOSIS — N529 Male erectile dysfunction, unspecified: Secondary | ICD-10-CM | POA: Diagnosis not present

## 2020-06-28 DIAGNOSIS — N5201 Erectile dysfunction due to arterial insufficiency: Secondary | ICD-10-CM | POA: Diagnosis not present

## 2020-07-05 ENCOUNTER — Ambulatory Visit (INDEPENDENT_AMBULATORY_CARE_PROVIDER_SITE_OTHER): Payer: PPO

## 2020-07-05 ENCOUNTER — Encounter: Payer: Self-pay | Admitting: Pulmonary Disease

## 2020-07-05 ENCOUNTER — Ambulatory Visit: Payer: PPO | Admitting: Pulmonary Disease

## 2020-07-05 ENCOUNTER — Other Ambulatory Visit: Payer: Self-pay

## 2020-07-05 VITALS — BP 144/78 | HR 70 | Temp 97.4°F | Ht 70.0 in | Wt 227.0 lb

## 2020-07-05 DIAGNOSIS — R0609 Other forms of dyspnea: Secondary | ICD-10-CM

## 2020-07-05 DIAGNOSIS — R0602 Shortness of breath: Secondary | ICD-10-CM | POA: Diagnosis not present

## 2020-07-05 DIAGNOSIS — U099 Post covid-19 condition, unspecified: Secondary | ICD-10-CM

## 2020-07-05 DIAGNOSIS — J9811 Atelectasis: Secondary | ICD-10-CM | POA: Diagnosis not present

## 2020-07-05 DIAGNOSIS — R0600 Dyspnea, unspecified: Secondary | ICD-10-CM | POA: Diagnosis not present

## 2020-07-05 NOTE — Progress Notes (Signed)
Tanner [name]    [id number]    09-07-1945  Primary Care Physician:Swayne, Shanon Brow, MD  Referring Physician: Antony Contras, MD [address] [place],  [place] [address]  Chief complaint: Consult for post COVID-74  HPI: 74 year old with hypertension, diabetes Tested positive for Covid in July 2020.  He was asymptomatic but was tested since he had a positive contact Since then he has had occasional dyspnea on exertion which he notices when he climbs up stairs or goes up inclines.  No symptoms at rest.  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  No feather pillows or comforter Smoking history: 20-pack-year smoker.  Quit in 2000 Travel history: No significant travel Relevant family history: No family history of lung disease  Outpatient Encounter Medications as of 07/05/2020  Medication Sig  . amLODipine (NORVASC) 10 MG tablet Take 10 mg by mouth daily.  Marland Kitchen losartan (COZAAR) 100 MG tablet Take 1 tablet (100 mg total) by mouth daily.  . metFORMIN (GLUCOPHAGE) 500 MG tablet Take 1 tablet (500 mg total) by mouth 2 (two) times daily with a meal.  . pravastatin (PRAVACHOL) 10 MG tablet Take 10 mg by mouth daily.   No facility-administered encounter medications on file as of 07/05/2020.    Allergies as of 07/05/2020 - Review Complete 07/05/2020  Allergen Reaction Noted  . Azithromycin  05/13/2019  . Prednisone  04/22/2017    Past Medical History:  Diagnosis Date  . Diabetes mellitus without complication ([place])   . Diverticulitis of colon   . Erectile dysfunction   . Hypertension   . Small bowel obstruction [facility])     Past Surgical History:  Procedure Laterality Date  . ABDOMINAL SURGERY    . BOWEL RESECTION    . CHOLECYSTECTOMY    . CHOLECYSTECTOMY OPEN    . COLOSTOMY TAKEDOWN  65s    Family History  Problem Relation Age of Onset  .  Cancer Mother   . Stroke Mother   . Dementia Father   . Heart disease Brother     Social History   Socioeconomic History  . Marital status: Married    Spouse name: Not on file  . Number of children: Not on file  . Years of education: Not on file  . Highest education level: Not on file  Occupational History  . Not on file  Tobacco Use  . Smoking status: Never Smoker  . Smokeless tobacco: Never Used  Substance and Sexual Activity  . Alcohol use: No  . Drug use: No  . Sexual activity: Yes    Birth control/protection: None  Other Topics Concern  . Not on file  Social History Narrative  . Not on file   Social Determinants of Health   Financial Resource Strain: Not on file  Food Insecurity: Not on file  Transportation Needs: Not on file  Physical Activity: Not on file  Stress: Not on file  Social Connections: Not on file  Intimate Partner Violence: Not on file    Review of systems: Review of Systems  Constitutional: Negative for fever and chills.  HENT: Negative.   Eyes: Negative for blurred vision.  Respiratory: as per HPI  Cardiovascular: Negative for chest pain and palpitations.  Gastrointestinal: Negative for vomiting, diarrhea, blood per rectum. Genitourinary: Negative for dysuria, urgency, frequency and hematuria.  Musculoskeletal: Negative for myalgias, back pain and  joint pain.  Skin: Negative for itching and rash.  Neurological: Negative for dizziness, tremors, focal weakness, seizures and loss of consciousness.  Endo/Heme/Allergies: Negative for environmental allergies.  Psychiatric/Behavioral: Negative for depression, suicidal ideas and hallucinations.  All other systems reviewed and are negative.  Physical Exam: Blood pressure (!) 144/78, pulse 70, temperature (!) 97.4 F (36.3 C), temperature source Skin, height 5\' 10"  (1.778 m), weight 227 lb (103 kg), SpO2 98 %. 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  Plan/Recommendations: Chest x-ray, PFTs. Exercise regimen.  Marshell Garfinkel MD [facility] 07/05/2020, 10:29 AM  CC: Antony Contras, MD

## 2020-07-05 NOTE — Patient Instructions (Signed)
We will get a chest x-ray and lung function test for better evaluation of the lung Maintain exercise regimen Follow-up in 3 months.

## 2020-07-06 DIAGNOSIS — E1169 Type 2 diabetes mellitus with other specified complication: Secondary | ICD-10-CM | POA: Diagnosis not present

## 2020-07-06 DIAGNOSIS — G4700 Insomnia, unspecified: Secondary | ICD-10-CM | POA: Diagnosis not present

## 2020-07-06 DIAGNOSIS — E782 Mixed hyperlipidemia: Secondary | ICD-10-CM | POA: Diagnosis not present

## 2020-07-06 DIAGNOSIS — E119 Type 2 diabetes mellitus without complications: Secondary | ICD-10-CM | POA: Diagnosis not present

## 2020-07-06 DIAGNOSIS — I10 Essential (primary) hypertension: Secondary | ICD-10-CM | POA: Diagnosis not present

## 2020-07-08 ENCOUNTER — Other Ambulatory Visit: Payer: Self-pay | Admitting: Pulmonary Disease

## 2020-07-08 DIAGNOSIS — U099 Post covid-19 condition, unspecified: Secondary | ICD-10-CM

## 2020-07-08 NOTE — Progress Notes (Signed)
Spoke with the pt and notified of results and recommendations from Dr Vaughan Browner. Pt verbalized understanding. He is agreeable to having HRCT and I have placed the order.

## 2020-07-28 ENCOUNTER — Ambulatory Visit
Admission: RE | Admit: 2020-07-28 | Discharge: 2020-07-28 | Disposition: A | Discharge status: Home / Self Care | Payer: PPO | Source: Ambulatory Visit | Attending: Pulmonary Disease | Admitting: Pulmonary Disease

## 2020-07-28 DIAGNOSIS — J479 Bronchiectasis, uncomplicated: Secondary | ICD-10-CM | POA: Diagnosis not present

## 2020-07-28 DIAGNOSIS — U099 Post covid-19 condition, unspecified: Secondary | ICD-10-CM

## 2020-07-28 DIAGNOSIS — I2510 Atherosclerotic heart disease of native coronary artery without angina pectoris: Secondary | ICD-10-CM | POA: Diagnosis not present

## 2020-07-28 DIAGNOSIS — R0609 Other forms of dyspnea: Secondary | ICD-10-CM | POA: Diagnosis not present

## 2020-07-28 DIAGNOSIS — M47814 Spondylosis without myelopathy or radiculopathy, thoracic region: Secondary | ICD-10-CM | POA: Diagnosis not present

## 2020-08-10 ENCOUNTER — Telehealth: Payer: Self-pay | Admitting: Pulmonary Disease

## 2020-08-10 NOTE — Telephone Encounter (Signed)
Dr Vaughan Browner, please advise on HRCT results from 07/28/20.

## 2020-08-11 DIAGNOSIS — E1169 Type 2 diabetes mellitus with other specified complication: Secondary | ICD-10-CM | POA: Diagnosis not present

## 2020-08-11 DIAGNOSIS — E782 Mixed hyperlipidemia: Secondary | ICD-10-CM | POA: Diagnosis not present

## 2020-08-11 DIAGNOSIS — I10 Essential (primary) hypertension: Secondary | ICD-10-CM | POA: Diagnosis not present

## 2020-08-11 DIAGNOSIS — G4700 Insomnia, unspecified: Secondary | ICD-10-CM | POA: Diagnosis not present

## 2020-08-11 DIAGNOSIS — E119 Type 2 diabetes mellitus without complications: Secondary | ICD-10-CM | POA: Diagnosis not present

## 2020-08-11 NOTE — Telephone Encounter (Signed)
I called and discussed the CT scan results with Mr. Larrabee. Nothing further needed

## 2020-08-11 NOTE — Telephone Encounter (Signed)
Dr. Vaughan Browner please advise on CT results from 07/28/20 patient calling in regards.  Called let patient know Dr. Vaughan Browner was looking at results and will get back to him when we hear recommendations patient voiced understanding

## 2020-09-23 ENCOUNTER — Inpatient Hospital Stay (HOSPITAL_COMMUNITY): Admission: RE | Admit: 2020-09-23 | Payer: PPO | Source: Ambulatory Visit

## 2020-09-27 ENCOUNTER — Ambulatory Visit: Payer: PPO | Admitting: Pulmonary Disease

## 2020-10-14 DIAGNOSIS — E782 Mixed hyperlipidemia: Secondary | ICD-10-CM | POA: Diagnosis not present

## 2020-10-14 DIAGNOSIS — E119 Type 2 diabetes mellitus without complications: Secondary | ICD-10-CM | POA: Diagnosis not present

## 2020-10-14 DIAGNOSIS — E1169 Type 2 diabetes mellitus with other specified complication: Secondary | ICD-10-CM | POA: Diagnosis not present

## 2020-10-14 DIAGNOSIS — I10 Essential (primary) hypertension: Secondary | ICD-10-CM | POA: Diagnosis not present

## 2020-10-14 DIAGNOSIS — G4700 Insomnia, unspecified: Secondary | ICD-10-CM | POA: Diagnosis not present

## 2020-12-06 DIAGNOSIS — Z7984 Long term (current) use of oral hypoglycemic drugs: Secondary | ICD-10-CM | POA: Diagnosis not present

## 2020-12-06 DIAGNOSIS — I77811 Abdominal aortic ectasia: Secondary | ICD-10-CM | POA: Diagnosis not present

## 2020-12-06 DIAGNOSIS — Z8719 Personal history of other diseases of the digestive system: Secondary | ICD-10-CM | POA: Diagnosis not present

## 2020-12-06 DIAGNOSIS — E782 Mixed hyperlipidemia: Secondary | ICD-10-CM | POA: Diagnosis not present

## 2020-12-06 DIAGNOSIS — N529 Male erectile dysfunction, unspecified: Secondary | ICD-10-CM | POA: Diagnosis not present

## 2020-12-06 DIAGNOSIS — R0600 Dyspnea, unspecified: Secondary | ICD-10-CM | POA: Diagnosis not present

## 2020-12-06 DIAGNOSIS — E291 Testicular hypofunction: Secondary | ICD-10-CM | POA: Diagnosis not present

## 2020-12-06 DIAGNOSIS — Z8619 Personal history of other infectious and parasitic diseases: Secondary | ICD-10-CM | POA: Diagnosis not present

## 2020-12-06 DIAGNOSIS — E1169 Type 2 diabetes mellitus with other specified complication: Secondary | ICD-10-CM | POA: Diagnosis not present

## 2020-12-06 DIAGNOSIS — Z87898 Personal history of other specified conditions: Secondary | ICD-10-CM | POA: Diagnosis not present

## 2020-12-06 DIAGNOSIS — I10 Essential (primary) hypertension: Secondary | ICD-10-CM | POA: Diagnosis not present

## 2020-12-06 DIAGNOSIS — D696 Thrombocytopenia, unspecified: Secondary | ICD-10-CM | POA: Diagnosis not present

## 2021-01-06 DIAGNOSIS — E782 Mixed hyperlipidemia: Secondary | ICD-10-CM | POA: Diagnosis not present

## 2021-01-06 DIAGNOSIS — E119 Type 2 diabetes mellitus without complications: Secondary | ICD-10-CM | POA: Diagnosis not present

## 2021-01-06 DIAGNOSIS — E1169 Type 2 diabetes mellitus with other specified complication: Secondary | ICD-10-CM | POA: Diagnosis not present

## 2021-01-06 DIAGNOSIS — I10 Essential (primary) hypertension: Secondary | ICD-10-CM | POA: Diagnosis not present

## 2021-01-06 DIAGNOSIS — G4700 Insomnia, unspecified: Secondary | ICD-10-CM | POA: Diagnosis not present

## 2021-01-24 DIAGNOSIS — H25012 Cortical age-related cataract, left eye: Secondary | ICD-10-CM | POA: Diagnosis not present

## 2021-01-24 DIAGNOSIS — H2511 Age-related nuclear cataract, right eye: Secondary | ICD-10-CM | POA: Diagnosis not present

## 2021-01-24 DIAGNOSIS — E119 Type 2 diabetes mellitus without complications: Secondary | ICD-10-CM | POA: Diagnosis not present

## 2021-01-24 DIAGNOSIS — H5213 Myopia, bilateral: Secondary | ICD-10-CM | POA: Diagnosis not present

## 2021-01-31 DIAGNOSIS — L570 Actinic keratosis: Secondary | ICD-10-CM | POA: Diagnosis not present

## 2021-01-31 DIAGNOSIS — D225 Melanocytic nevi of trunk: Secondary | ICD-10-CM | POA: Diagnosis not present

## 2021-01-31 DIAGNOSIS — D485 Neoplasm of uncertain behavior of skin: Secondary | ICD-10-CM | POA: Diagnosis not present

## 2021-01-31 DIAGNOSIS — L821 Other seborrheic keratosis: Secondary | ICD-10-CM | POA: Diagnosis not present

## 2021-01-31 DIAGNOSIS — D224 Melanocytic nevi of scalp and neck: Secondary | ICD-10-CM | POA: Diagnosis not present

## 2021-01-31 DIAGNOSIS — L439 Lichen planus, unspecified: Secondary | ICD-10-CM | POA: Diagnosis not present

## 2021-01-31 DIAGNOSIS — L814 Other melanin hyperpigmentation: Secondary | ICD-10-CM | POA: Diagnosis not present

## 2021-01-31 DIAGNOSIS — Z85828 Personal history of other malignant neoplasm of skin: Secondary | ICD-10-CM | POA: Diagnosis not present

## 2021-01-31 DIAGNOSIS — L905 Scar conditions and fibrosis of skin: Secondary | ICD-10-CM | POA: Diagnosis not present

## 2021-03-01 DIAGNOSIS — E782 Mixed hyperlipidemia: Secondary | ICD-10-CM | POA: Diagnosis not present

## 2021-03-01 DIAGNOSIS — E1169 Type 2 diabetes mellitus with other specified complication: Secondary | ICD-10-CM | POA: Diagnosis not present

## 2021-03-01 DIAGNOSIS — G4700 Insomnia, unspecified: Secondary | ICD-10-CM | POA: Diagnosis not present

## 2021-03-01 DIAGNOSIS — E119 Type 2 diabetes mellitus without complications: Secondary | ICD-10-CM | POA: Diagnosis not present

## 2021-03-01 DIAGNOSIS — I10 Essential (primary) hypertension: Secondary | ICD-10-CM | POA: Diagnosis not present

## 2021-04-19 DIAGNOSIS — E782 Mixed hyperlipidemia: Secondary | ICD-10-CM | POA: Diagnosis not present

## 2021-04-19 DIAGNOSIS — I10 Essential (primary) hypertension: Secondary | ICD-10-CM | POA: Diagnosis not present

## 2021-04-19 DIAGNOSIS — G4700 Insomnia, unspecified: Secondary | ICD-10-CM | POA: Diagnosis not present

## 2021-04-19 DIAGNOSIS — E1169 Type 2 diabetes mellitus with other specified complication: Secondary | ICD-10-CM | POA: Diagnosis not present

## 2021-06-03 IMAGING — CT CT CHEST HIGH RESOLUTION W/O CM
2 of 7 series · 15 of 36 positions shown, 18 images · non-contrast
Comparison: 07/05/2020 chest radiograph.

CLINICAL DATA: [id number] infection in December 2018, with persistent
dyspnea on exertion and chest pain. Former smoker.

EXAM:
CT CHEST WITHOUT CONTRAST
TECHNIQUE: Multidetector CT imaging of the chest was performed following the
standard protocol without intravenous contrast. High resolution
imaging of the lungs, as well as inspiratory and expiratory imaging,
was performed.

[Series 4: chest 2.00 br36 s3 cor soft · coronal · 0.68mm/px · 3 of 213 slices shown]
[im 43/213  lung]
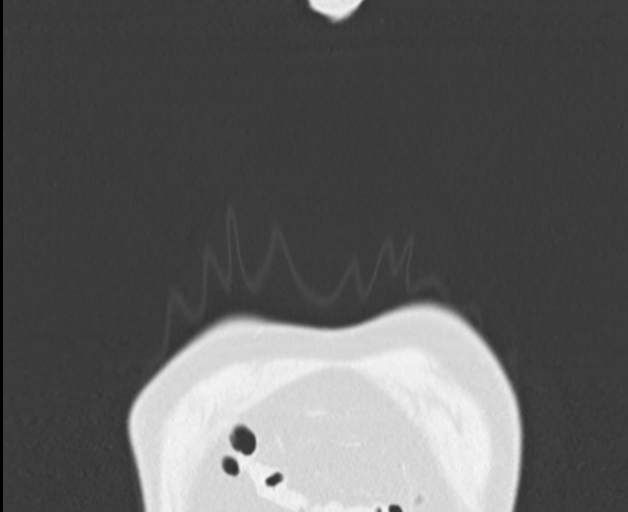
[im 85/213  lung]
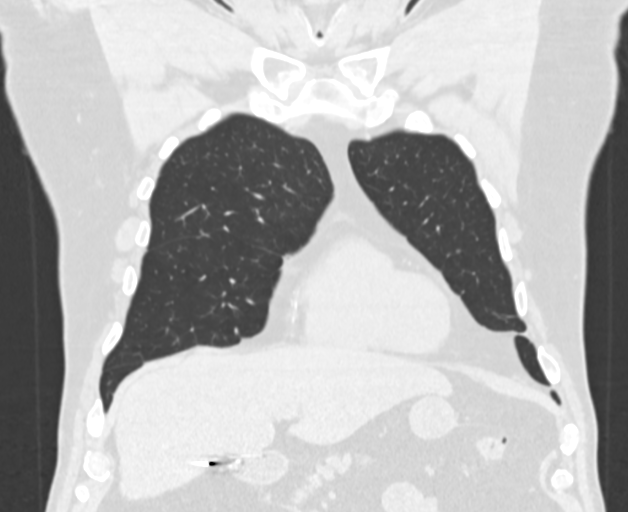
[im 128/213  lung]
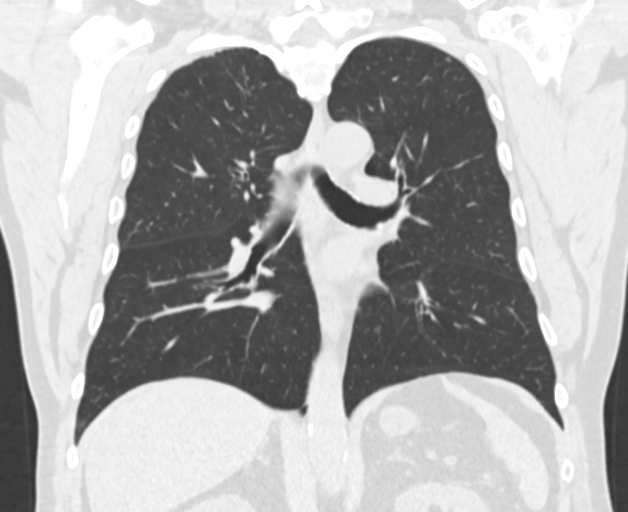

[Series 11: chest 1.00 br60 s3 high res thins 1x1 mm · axial · 0.83mm/px · z∈[+1547,+1840]mm · 12 of 347 slices shown, 15 images]
[im 27/347  mediastinal]
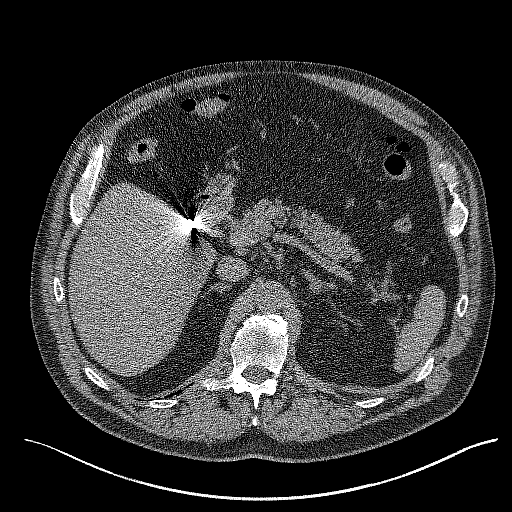
[im 27/347  lung]
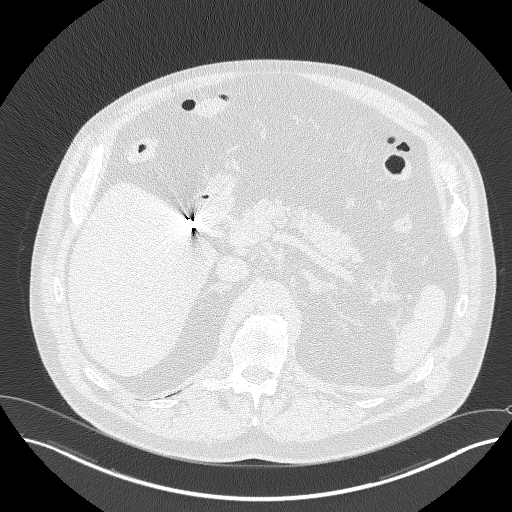
[im 54/347  lung]
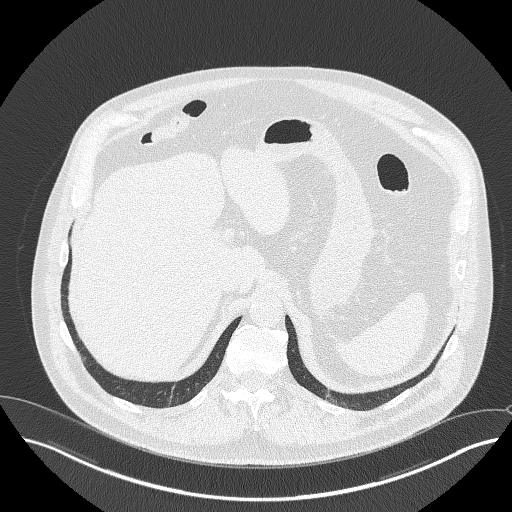
[im 80/347  lung]
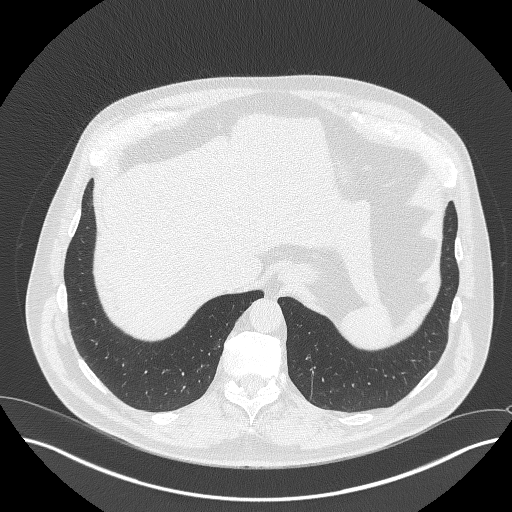
[im 107/347  lung]
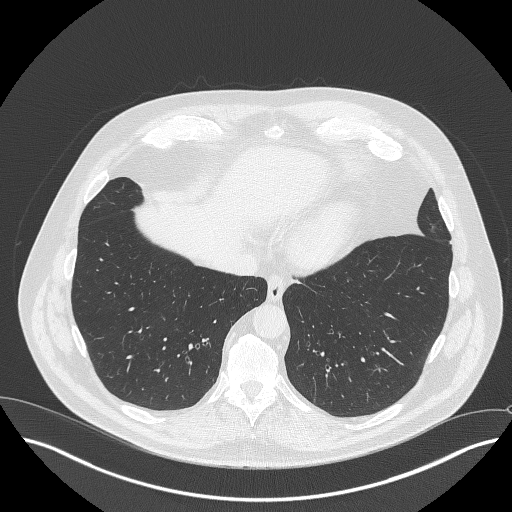
[im 134/347  mediastinal]
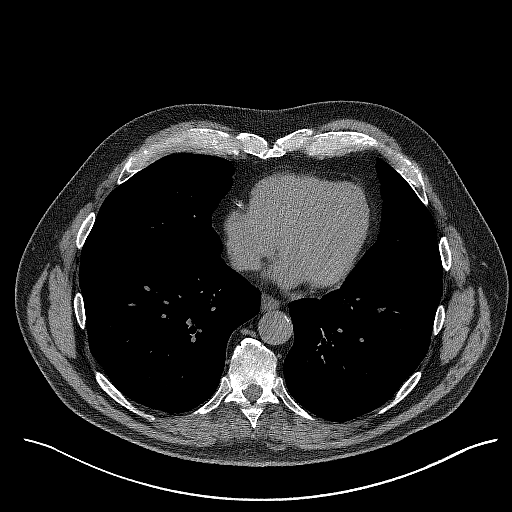
[im 134/347  lung]
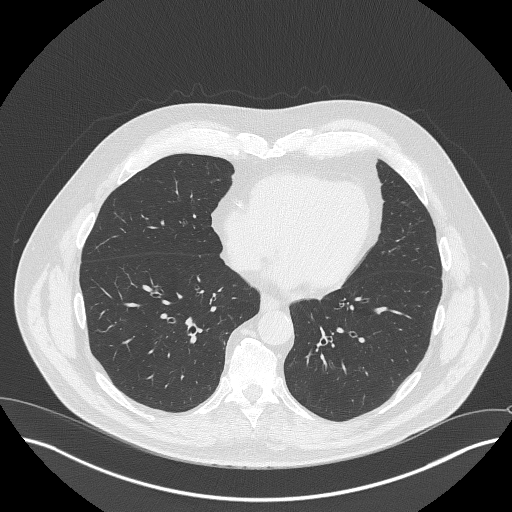
[im 160/347  lung]
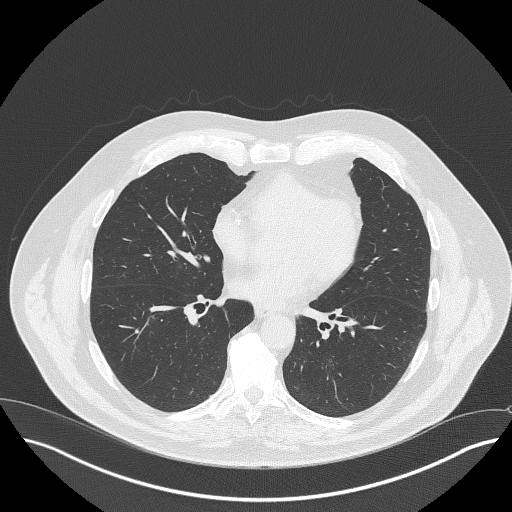
[im 187/347  lung]
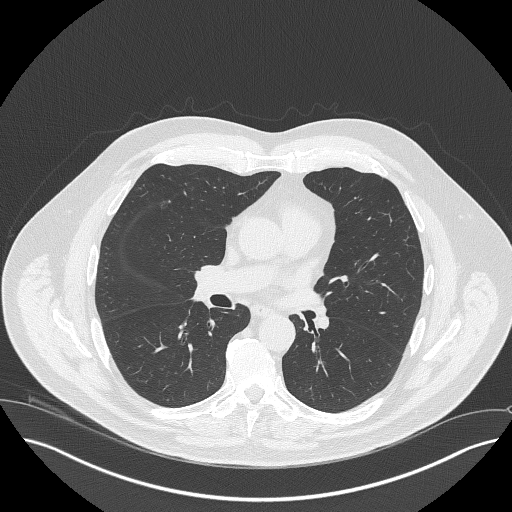
[im 213/347  lung]
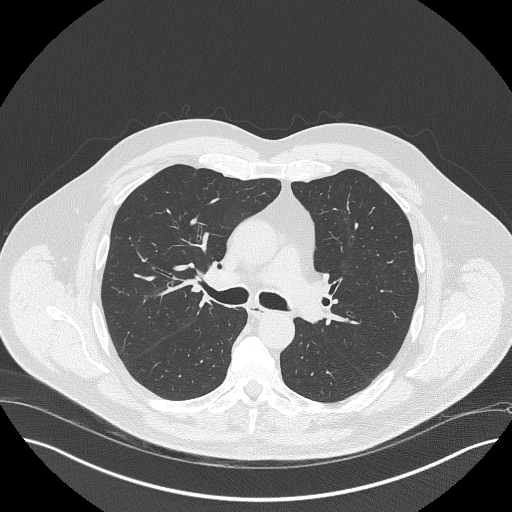
[im 240/347  mediastinal]
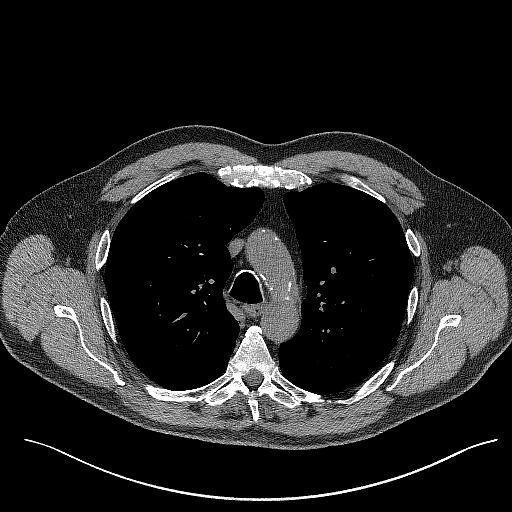
[im 240/347  lung]
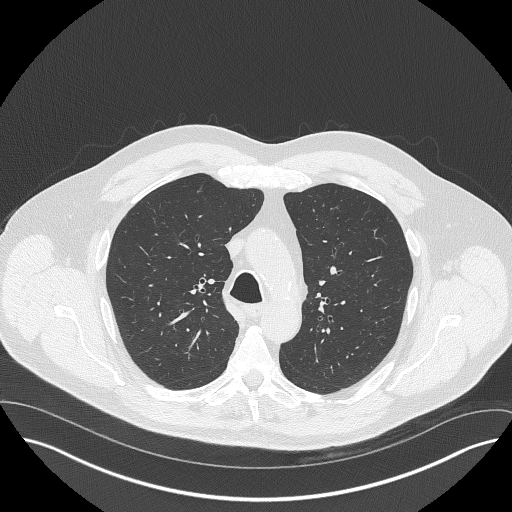
[im 267/347  lung]
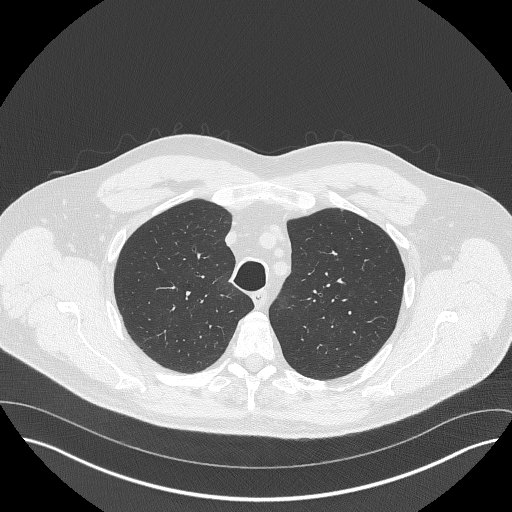
[im 293/347  lung]
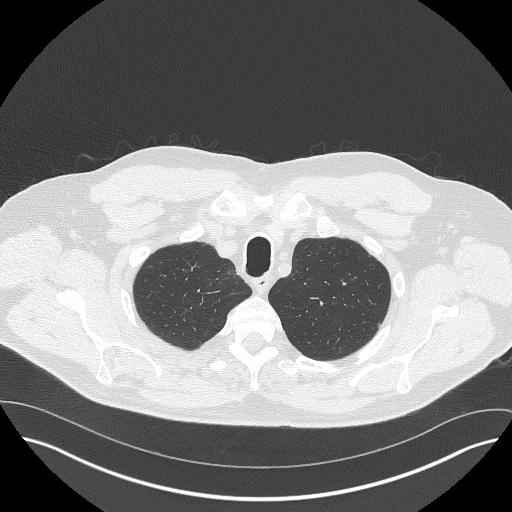
[im 320/347  lung]
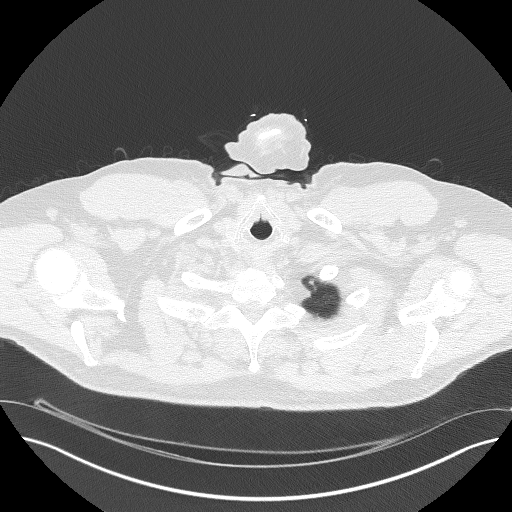

[15 of 36 positions shown; findings below may reference images not displayed]

FINDINGS: Cardiovascular: Normal heart size. No significant pericardial
effusion/thickening. Three-vessel coronary atherosclerosis.
Atherosclerotic nonaneurysmal thoracic aorta. Normal caliber
pulmonary arteries.

Mediastinum/Nodes: No discrete thyroid nodules. Unremarkable
esophagus. No pathologically enlarged axillary, mediastinal or hilar
lymph nodes, noting limited sensitivity for the detection of hilar
adenopathy on this noncontrast study.

Lungs/Pleura: No pneumothorax. No pleural effusion. No acute
consolidative airspace disease, lung masses or significant pulmonary
nodules. No significant air trapping or evidence of
tracheobronchomalacia on the expiration sequence. Scattered regions
of minimal cylindrical bronchiolectasis in the mid to lower lungs
bilaterally, for example in the medial right middle lobe (series
8/image 115), in the lingula (series 3/image 114) and in the medial
right lower lobe (series 8/image 130), with associated scattered
minimal foci of mucoid impaction in the lower lobes. No significant
regions of subpleural reticulation, ground-glass attenuation,
architectural distortion or frank honeycombing. A few thin scattered
parenchymal bands at both lung bases.

Upper abdomen: Cholecystectomy.

Musculoskeletal: No aggressive appearing focal osseous lesions.
Moderate thoracic spondylosis.
IMPRESSION: 1. Scattered minimal cylindrical bronchiolectasis and minimal mucoid
impaction in the mid to lower lungs. No evidence of interstitial
lung disease.
2. Three-vessel coronary atherosclerosis.
3. Aortic Atherosclerosis ([id number]).

## 2021-06-29 DIAGNOSIS — N5201 Erectile dysfunction due to arterial insufficiency: Secondary | ICD-10-CM | POA: Diagnosis not present

## 2021-06-29 DIAGNOSIS — N400 Enlarged prostate without lower urinary tract symptoms: Secondary | ICD-10-CM | POA: Diagnosis not present

## 2021-07-25 DIAGNOSIS — I10 Essential (primary) hypertension: Secondary | ICD-10-CM | POA: Diagnosis not present

## 2021-07-25 DIAGNOSIS — N529 Male erectile dysfunction, unspecified: Secondary | ICD-10-CM | POA: Diagnosis not present

## 2021-07-25 DIAGNOSIS — D696 Thrombocytopenia, unspecified: Secondary | ICD-10-CM | POA: Diagnosis not present

## 2021-07-25 DIAGNOSIS — R319 Hematuria, unspecified: Secondary | ICD-10-CM | POA: Diagnosis not present

## 2021-07-25 DIAGNOSIS — Z1389 Encounter for screening for other disorder: Secondary | ICD-10-CM | POA: Diagnosis not present

## 2021-07-25 DIAGNOSIS — E291 Testicular hypofunction: Secondary | ICD-10-CM | POA: Diagnosis not present

## 2021-07-25 DIAGNOSIS — E1169 Type 2 diabetes mellitus with other specified complication: Secondary | ICD-10-CM | POA: Diagnosis not present

## 2021-07-25 DIAGNOSIS — I77811 Abdominal aortic ectasia: Secondary | ICD-10-CM | POA: Diagnosis not present

## 2021-07-25 DIAGNOSIS — Z87898 Personal history of other specified conditions: Secondary | ICD-10-CM | POA: Diagnosis not present

## 2021-07-25 DIAGNOSIS — E782 Mixed hyperlipidemia: Secondary | ICD-10-CM | POA: Diagnosis not present

## 2021-07-25 DIAGNOSIS — Z7984 Long term (current) use of oral hypoglycemic drugs: Secondary | ICD-10-CM | POA: Diagnosis not present

## 2021-07-25 DIAGNOSIS — Z0000 Encounter for general adult medical examination without abnormal findings: Secondary | ICD-10-CM | POA: Diagnosis not present

## 2022-01-05 DIAGNOSIS — R972 Elevated prostate specific antigen [PSA]: Secondary | ICD-10-CM | POA: Diagnosis not present

## 2022-01-05 DIAGNOSIS — E1169 Type 2 diabetes mellitus with other specified complication: Secondary | ICD-10-CM | POA: Diagnosis not present

## 2022-01-05 DIAGNOSIS — E291 Testicular hypofunction: Secondary | ICD-10-CM | POA: Diagnosis not present

## 2022-01-05 DIAGNOSIS — E782 Mixed hyperlipidemia: Secondary | ICD-10-CM | POA: Diagnosis not present

## 2022-01-05 DIAGNOSIS — Z8719 Personal history of other diseases of the digestive system: Secondary | ICD-10-CM | POA: Diagnosis not present

## 2022-01-05 DIAGNOSIS — Z7984 Long term (current) use of oral hypoglycemic drugs: Secondary | ICD-10-CM | POA: Diagnosis not present

## 2022-01-05 DIAGNOSIS — I77811 Abdominal aortic ectasia: Secondary | ICD-10-CM | POA: Diagnosis not present

## 2022-01-05 DIAGNOSIS — N529 Male erectile dysfunction, unspecified: Secondary | ICD-10-CM | POA: Diagnosis not present

## 2022-01-05 DIAGNOSIS — D696 Thrombocytopenia, unspecified: Secondary | ICD-10-CM | POA: Diagnosis not present

## 2022-01-05 DIAGNOSIS — I10 Essential (primary) hypertension: Secondary | ICD-10-CM | POA: Diagnosis not present

## 2022-02-15 DIAGNOSIS — E119 Type 2 diabetes mellitus without complications: Secondary | ICD-10-CM | POA: Diagnosis not present

## 2022-02-15 DIAGNOSIS — H2512 Age-related nuclear cataract, left eye: Secondary | ICD-10-CM | POA: Diagnosis not present

## 2022-02-15 DIAGNOSIS — H5213 Myopia, bilateral: Secondary | ICD-10-CM | POA: Diagnosis not present

## 2022-02-15 DIAGNOSIS — H25012 Cortical age-related cataract, left eye: Secondary | ICD-10-CM | POA: Diagnosis not present

## 2022-04-04 DIAGNOSIS — H2512 Age-related nuclear cataract, left eye: Secondary | ICD-10-CM | POA: Diagnosis not present

## 2022-04-04 DIAGNOSIS — Z961 Presence of intraocular lens: Secondary | ICD-10-CM | POA: Diagnosis not present

## 2022-04-04 DIAGNOSIS — H269 Unspecified cataract: Secondary | ICD-10-CM | POA: Diagnosis not present

## 2022-04-04 DIAGNOSIS — H25012 Cortical age-related cataract, left eye: Secondary | ICD-10-CM | POA: Diagnosis not present

## 2022-04-04 DIAGNOSIS — H25812 Combined forms of age-related cataract, left eye: Secondary | ICD-10-CM | POA: Diagnosis not present

## 2022-06-06 DIAGNOSIS — Z961 Presence of intraocular lens: Secondary | ICD-10-CM | POA: Diagnosis not present

## 2022-07-27 DIAGNOSIS — L821 Other seborrheic keratosis: Secondary | ICD-10-CM | POA: Diagnosis not present

## 2022-07-27 DIAGNOSIS — Z08 Encounter for follow-up examination after completed treatment for malignant neoplasm: Secondary | ICD-10-CM | POA: Diagnosis not present

## 2022-07-27 DIAGNOSIS — Z85828 Personal history of other malignant neoplasm of skin: Secondary | ICD-10-CM | POA: Diagnosis not present

## 2022-07-27 DIAGNOSIS — L814 Other melanin hyperpigmentation: Secondary | ICD-10-CM | POA: Diagnosis not present

## 2022-07-27 DIAGNOSIS — D225 Melanocytic nevi of trunk: Secondary | ICD-10-CM | POA: Diagnosis not present

## 2022-08-28 DIAGNOSIS — Z1331 Encounter for screening for depression: Secondary | ICD-10-CM | POA: Diagnosis not present

## 2022-08-28 DIAGNOSIS — R152 Fecal urgency: Secondary | ICD-10-CM | POA: Diagnosis not present

## 2022-08-28 DIAGNOSIS — E782 Mixed hyperlipidemia: Secondary | ICD-10-CM | POA: Diagnosis not present

## 2022-08-28 DIAGNOSIS — Z0000 Encounter for general adult medical examination without abnormal findings: Secondary | ICD-10-CM | POA: Diagnosis not present

## 2022-08-28 DIAGNOSIS — E1169 Type 2 diabetes mellitus with other specified complication: Secondary | ICD-10-CM | POA: Diagnosis not present

## 2022-08-28 DIAGNOSIS — E1165 Type 2 diabetes mellitus with hyperglycemia: Secondary | ICD-10-CM | POA: Diagnosis not present

## 2022-08-28 DIAGNOSIS — R350 Frequency of micturition: Secondary | ICD-10-CM | POA: Diagnosis not present

## 2022-08-28 DIAGNOSIS — Z87898 Personal history of other specified conditions: Secondary | ICD-10-CM | POA: Diagnosis not present

## 2022-08-28 DIAGNOSIS — D696 Thrombocytopenia, unspecified: Secondary | ICD-10-CM | POA: Diagnosis not present

## 2022-08-28 DIAGNOSIS — I77811 Abdominal aortic ectasia: Secondary | ICD-10-CM | POA: Diagnosis not present

## 2022-08-28 DIAGNOSIS — E291 Testicular hypofunction: Secondary | ICD-10-CM | POA: Diagnosis not present

## 2022-08-28 DIAGNOSIS — I10 Essential (primary) hypertension: Secondary | ICD-10-CM | POA: Diagnosis not present

## 2022-08-28 DIAGNOSIS — Z23 Encounter for immunization: Secondary | ICD-10-CM | POA: Diagnosis not present

## 2022-08-29 DIAGNOSIS — Z1211 Encounter for screening for malignant neoplasm of colon: Secondary | ICD-10-CM | POA: Diagnosis not present

## 2022-09-15 DIAGNOSIS — R3915 Urgency of urination: Secondary | ICD-10-CM | POA: Diagnosis not present

## 2022-09-15 DIAGNOSIS — N5201 Erectile dysfunction due to arterial insufficiency: Secondary | ICD-10-CM | POA: Diagnosis not present

## 2022-09-15 DIAGNOSIS — N401 Enlarged prostate with lower urinary tract symptoms: Secondary | ICD-10-CM | POA: Diagnosis not present

## 2022-09-15 DIAGNOSIS — R350 Frequency of micturition: Secondary | ICD-10-CM | POA: Diagnosis not present

## 2022-11-13 DIAGNOSIS — R3915 Urgency of urination: Secondary | ICD-10-CM | POA: Diagnosis not present

## 2022-11-13 DIAGNOSIS — R350 Frequency of micturition: Secondary | ICD-10-CM | POA: Diagnosis not present

## 2022-11-21 DIAGNOSIS — R350 Frequency of micturition: Secondary | ICD-10-CM | POA: Diagnosis not present

## 2022-11-21 DIAGNOSIS — N5201 Erectile dysfunction due to arterial insufficiency: Secondary | ICD-10-CM | POA: Diagnosis not present

## 2022-11-21 DIAGNOSIS — N401 Enlarged prostate with lower urinary tract symptoms: Secondary | ICD-10-CM | POA: Diagnosis not present

## 2022-11-21 DIAGNOSIS — R3915 Urgency of urination: Secondary | ICD-10-CM | POA: Diagnosis not present

## 2023-03-01 DIAGNOSIS — N529 Male erectile dysfunction, unspecified: Secondary | ICD-10-CM | POA: Diagnosis not present

## 2023-03-01 DIAGNOSIS — R972 Elevated prostate specific antigen [PSA]: Secondary | ICD-10-CM | POA: Diagnosis not present

## 2023-03-01 DIAGNOSIS — D696 Thrombocytopenia, unspecified: Secondary | ICD-10-CM | POA: Diagnosis not present

## 2023-03-01 DIAGNOSIS — Z8719 Personal history of other diseases of the digestive system: Secondary | ICD-10-CM | POA: Diagnosis not present

## 2023-03-01 DIAGNOSIS — I10 Essential (primary) hypertension: Secondary | ICD-10-CM | POA: Diagnosis not present

## 2023-03-01 DIAGNOSIS — E782 Mixed hyperlipidemia: Secondary | ICD-10-CM | POA: Diagnosis not present

## 2023-03-01 DIAGNOSIS — E291 Testicular hypofunction: Secondary | ICD-10-CM | POA: Diagnosis not present

## 2023-03-01 DIAGNOSIS — E1169 Type 2 diabetes mellitus with other specified complication: Secondary | ICD-10-CM | POA: Diagnosis not present

## 2023-03-01 DIAGNOSIS — I77811 Abdominal aortic ectasia: Secondary | ICD-10-CM | POA: Diagnosis not present

## 2023-03-01 DIAGNOSIS — E119 Type 2 diabetes mellitus without complications: Secondary | ICD-10-CM | POA: Diagnosis not present

## 2023-06-06 DIAGNOSIS — H26491 Other secondary cataract, right eye: Secondary | ICD-10-CM | POA: Diagnosis not present

## 2023-06-06 DIAGNOSIS — Z961 Presence of intraocular lens: Secondary | ICD-10-CM | POA: Diagnosis not present

## 2023-09-21 DIAGNOSIS — Z1331 Encounter for screening for depression: Secondary | ICD-10-CM | POA: Diagnosis not present

## 2023-09-21 DIAGNOSIS — E291 Testicular hypofunction: Secondary | ICD-10-CM | POA: Diagnosis not present

## 2023-09-21 DIAGNOSIS — Z1211 Encounter for screening for malignant neoplasm of colon: Secondary | ICD-10-CM | POA: Diagnosis not present

## 2023-09-21 DIAGNOSIS — Z0000 Encounter for general adult medical examination without abnormal findings: Secondary | ICD-10-CM | POA: Diagnosis not present

## 2023-09-21 DIAGNOSIS — E782 Mixed hyperlipidemia: Secondary | ICD-10-CM | POA: Diagnosis not present

## 2023-09-21 DIAGNOSIS — N529 Male erectile dysfunction, unspecified: Secondary | ICD-10-CM | POA: Diagnosis not present

## 2023-09-21 DIAGNOSIS — I10 Essential (primary) hypertension: Secondary | ICD-10-CM | POA: Diagnosis not present

## 2023-09-21 DIAGNOSIS — E1169 Type 2 diabetes mellitus with other specified complication: Secondary | ICD-10-CM | POA: Diagnosis not present

## 2023-09-21 DIAGNOSIS — Z2911 Encounter for prophylactic immunotherapy for respiratory syncytial virus (RSV): Secondary | ICD-10-CM | POA: Diagnosis not present

## 2023-09-21 DIAGNOSIS — Z87898 Personal history of other specified conditions: Secondary | ICD-10-CM | POA: Diagnosis not present

## 2023-09-21 DIAGNOSIS — Z8719 Personal history of other diseases of the digestive system: Secondary | ICD-10-CM | POA: Diagnosis not present

## 2023-09-21 DIAGNOSIS — D696 Thrombocytopenia, unspecified: Secondary | ICD-10-CM | POA: Diagnosis not present

## 2023-09-26 DIAGNOSIS — Z1211 Encounter for screening for malignant neoplasm of colon: Secondary | ICD-10-CM | POA: Diagnosis not present

## 2023-10-03 DIAGNOSIS — H903 Sensorineural hearing loss, bilateral: Secondary | ICD-10-CM | POA: Diagnosis not present

## 2023-10-17 DIAGNOSIS — H903 Sensorineural hearing loss, bilateral: Secondary | ICD-10-CM | POA: Diagnosis not present

## 2023-11-20 DIAGNOSIS — R350 Frequency of micturition: Secondary | ICD-10-CM | POA: Diagnosis not present

## 2023-11-20 DIAGNOSIS — N401 Enlarged prostate with lower urinary tract symptoms: Secondary | ICD-10-CM | POA: Diagnosis not present

## 2024-03-13 DIAGNOSIS — H43811 Vitreous degeneration, right eye: Secondary | ICD-10-CM | POA: Diagnosis not present

## 2024-03-13 DIAGNOSIS — H26491 Other secondary cataract, right eye: Secondary | ICD-10-CM | POA: Diagnosis not present

## 2024-03-13 DIAGNOSIS — Z961 Presence of intraocular lens: Secondary | ICD-10-CM | POA: Diagnosis not present

## 2024-03-20 DIAGNOSIS — H26491 Other secondary cataract, right eye: Secondary | ICD-10-CM | POA: Diagnosis not present

## 2024-03-24 DIAGNOSIS — I10 Essential (primary) hypertension: Secondary | ICD-10-CM | POA: Diagnosis not present

## 2024-03-24 DIAGNOSIS — I77811 Abdominal aortic ectasia: Secondary | ICD-10-CM | POA: Diagnosis not present

## 2024-03-24 DIAGNOSIS — D696 Thrombocytopenia, unspecified: Secondary | ICD-10-CM | POA: Diagnosis not present

## 2024-03-24 DIAGNOSIS — Z8719 Personal history of other diseases of the digestive system: Secondary | ICD-10-CM | POA: Diagnosis not present

## 2024-03-24 DIAGNOSIS — E1169 Type 2 diabetes mellitus with other specified complication: Secondary | ICD-10-CM | POA: Diagnosis not present

## 2024-03-24 DIAGNOSIS — N529 Male erectile dysfunction, unspecified: Secondary | ICD-10-CM | POA: Diagnosis not present

## 2024-03-24 DIAGNOSIS — R972 Elevated prostate specific antigen [PSA]: Secondary | ICD-10-CM | POA: Diagnosis not present

## 2024-03-24 DIAGNOSIS — E291 Testicular hypofunction: Secondary | ICD-10-CM | POA: Diagnosis not present

## 2024-03-24 DIAGNOSIS — E782 Mixed hyperlipidemia: Secondary | ICD-10-CM | POA: Diagnosis not present

## 2024-03-24 DIAGNOSIS — Z6829 Body mass index (BMI) 29.0-29.9, adult: Secondary | ICD-10-CM | POA: Diagnosis not present

## 2024-06-05 DIAGNOSIS — Z961 Presence of intraocular lens: Secondary | ICD-10-CM | POA: Diagnosis not present
